# Patient Record
Sex: Female | Born: 1955 | Race: White | Hispanic: No | Marital: Married | Smoking: Former smoker
Health system: Southern US, Community
[De-identification: ages and names within clinical notes are randomized; demographics above are authoritative.]

## PROBLEM LIST (undated history)

## (undated) DIAGNOSIS — M8580 Other specified disorders of bone density and structure, unspecified site: Secondary | ICD-10-CM

## (undated) DIAGNOSIS — E079 Disorder of thyroid, unspecified: Secondary | ICD-10-CM

## (undated) DIAGNOSIS — E782 Mixed hyperlipidemia: Secondary | ICD-10-CM

## (undated) DIAGNOSIS — I7300 Raynaud's syndrome without gangrene: Secondary | ICD-10-CM

## (undated) DIAGNOSIS — E063 Autoimmune thyroiditis: Secondary | ICD-10-CM

## (undated) DIAGNOSIS — E039 Hypothyroidism, unspecified: Secondary | ICD-10-CM

## (undated) DIAGNOSIS — R768 Other specified abnormal immunological findings in serum: Secondary | ICD-10-CM

## (undated) DIAGNOSIS — C50919 Malignant neoplasm of unspecified site of unspecified female breast: Secondary | ICD-10-CM

## (undated) DIAGNOSIS — E042 Nontoxic multinodular goiter: Secondary | ICD-10-CM

## (undated) DIAGNOSIS — Z79811 Long term (current) use of aromatase inhibitors: Secondary | ICD-10-CM

## (undated) HISTORY — PX: BREAST BIOPSY: SHX20

## (undated) HISTORY — DX: Malignant neoplasm of unspecified site of unspecified female breast: C50.919

## (undated) HISTORY — DX: Disorder of thyroid, unspecified: E07.9

## (undated) HISTORY — PX: COLONOSCOPY: SHX174

---

## 2017-07-27 DIAGNOSIS — Z923 Personal history of irradiation: Secondary | ICD-10-CM

## 2017-07-27 DIAGNOSIS — Z9221 Personal history of antineoplastic chemotherapy: Secondary | ICD-10-CM

## 2017-07-27 DIAGNOSIS — C801 Malignant (primary) neoplasm, unspecified: Secondary | ICD-10-CM

## 2017-07-27 DIAGNOSIS — Z170 Estrogen receptor positive status [ER+]: Secondary | ICD-10-CM

## 2017-07-27 DIAGNOSIS — C50411 Malignant neoplasm of upper-outer quadrant of right female breast: Secondary | ICD-10-CM

## 2017-07-27 HISTORY — PX: BREAST LUMPECTOMY: SHX2

## 2017-07-27 HISTORY — DX: Personal history of antineoplastic chemotherapy: Z92.21

## 2017-07-27 HISTORY — PX: BREAST BIOPSY: SHX20

## 2017-07-27 HISTORY — DX: Malignant (primary) neoplasm, unspecified: C80.1

## 2017-07-27 HISTORY — DX: Malignant neoplasm of upper-outer quadrant of right female breast: Z17.0

## 2017-07-27 HISTORY — DX: Personal history of irradiation: Z92.3

## 2017-07-27 HISTORY — DX: Malignant neoplasm of upper-outer quadrant of right female breast: C50.411

## 2018-07-28 DIAGNOSIS — C50411 Malignant neoplasm of upper-outer quadrant of right female breast: Secondary | ICD-10-CM | POA: Insufficient documentation

## 2018-07-28 DIAGNOSIS — Z170 Estrogen receptor positive status [ER+]: Secondary | ICD-10-CM | POA: Insufficient documentation

## 2018-08-02 DIAGNOSIS — D701 Agranulocytosis secondary to cancer chemotherapy: Secondary | ICD-10-CM | POA: Insufficient documentation

## 2018-08-02 DIAGNOSIS — T451X5A Adverse effect of antineoplastic and immunosuppressive drugs, initial encounter: Secondary | ICD-10-CM | POA: Insufficient documentation

## 2019-08-30 ENCOUNTER — Other Ambulatory Visit: Payer: Self-pay | Admitting: Hematology and Oncology

## 2019-08-30 DIAGNOSIS — Z1231 Encounter for screening mammogram for malignant neoplasm of breast: Secondary | ICD-10-CM

## 2019-09-01 ENCOUNTER — Other Ambulatory Visit: Payer: Self-pay | Admitting: Hematology and Oncology

## 2019-09-01 DIAGNOSIS — Z853 Personal history of malignant neoplasm of breast: Secondary | ICD-10-CM

## 2019-09-01 DIAGNOSIS — Z1231 Encounter for screening mammogram for malignant neoplasm of breast: Secondary | ICD-10-CM

## 2019-09-08 ENCOUNTER — Ambulatory Visit
Admission: RE | Admit: 2019-09-08 | Discharge: 2019-09-08 | Disposition: A | Discharge status: Home / Self Care | Payer: 59 | Source: Ambulatory Visit | Attending: Hematology and Oncology | Admitting: Hematology and Oncology

## 2019-09-08 DIAGNOSIS — Z853 Personal history of malignant neoplasm of breast: Secondary | ICD-10-CM

## 2019-09-08 DIAGNOSIS — Z1231 Encounter for screening mammogram for malignant neoplasm of breast: Secondary | ICD-10-CM | POA: Diagnosis present

## 2020-06-11 ENCOUNTER — Encounter: Payer: Self-pay | Admitting: Internal Medicine

## 2020-06-11 ENCOUNTER — Other Ambulatory Visit: Payer: Self-pay | Admitting: Internal Medicine

## 2020-06-12 ENCOUNTER — Encounter: Payer: Self-pay | Admitting: Internal Medicine

## 2020-06-12 ENCOUNTER — Ambulatory Visit (INDEPENDENT_AMBULATORY_CARE_PROVIDER_SITE_OTHER): Payer: 59 | Admitting: Internal Medicine

## 2020-06-12 ENCOUNTER — Other Ambulatory Visit: Payer: Self-pay

## 2020-06-12 VITALS — BP 110/74 | HR 70 | Temp 98.0°F | Ht 65.5 in | Wt 175.0 lb

## 2020-06-12 DIAGNOSIS — G8929 Other chronic pain: Secondary | ICD-10-CM

## 2020-06-12 DIAGNOSIS — C50411 Malignant neoplasm of upper-outer quadrant of right female breast: Secondary | ICD-10-CM

## 2020-06-12 DIAGNOSIS — R1313 Dysphagia, pharyngeal phase: Secondary | ICD-10-CM | POA: Insufficient documentation

## 2020-06-12 DIAGNOSIS — Z170 Estrogen receptor positive status [ER+]: Secondary | ICD-10-CM

## 2020-06-12 DIAGNOSIS — Z23 Encounter for immunization: Secondary | ICD-10-CM

## 2020-06-12 DIAGNOSIS — M25512 Pain in left shoulder: Secondary | ICD-10-CM | POA: Diagnosis not present

## 2020-06-12 DIAGNOSIS — E042 Nontoxic multinodular goiter: Secondary | ICD-10-CM | POA: Insufficient documentation

## 2020-06-12 MED ORDER — PREDNISONE 10 MG PO TABS: 10.0000 mg | ORAL_TABLET | ORAL | 0 refills | Status: AC
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
## 2020-06-13 LAB — CBC WITH DIFFERENTIAL/PLATELET
Basophils Absolute: 0 10*3/uL (ref 0.0–0.2)
Basos: 1 %
EOS (ABSOLUTE): 0.3 10*3/uL (ref 0.0–0.4)
Eos: 6 %
Hematocrit: 38.9 % (ref 34.0–46.6)
Hemoglobin: 13 g/dL (ref 11.1–15.9)
Immature Grans (Abs): 0 10*3/uL (ref 0.0–0.1)
Immature Granulocytes: 0 %
Lymphocytes Absolute: 1.3 10*3/uL (ref 0.7–3.1)
Lymphs: 25 %
MCH: 30.8 pg (ref 26.6–33.0)
MCHC: 33.4 g/dL (ref 31.5–35.7)
MCV: 92 fL (ref 79–97)
Monocytes Absolute: 0.6 10*3/uL (ref 0.1–0.9)
Monocytes: 12 %
Neutrophils Absolute: 3 10*3/uL (ref 1.4–7.0)
Neutrophils: 56 %
Platelets: 242 10*3/uL (ref 150–450)
RBC: 4.22 x10E6/uL (ref 3.77–5.28)
RDW: 12.3 % (ref 11.7–15.4)
WBC: 5.3 10*3/uL (ref 3.4–10.8)

## 2020-06-13 LAB — COMPREHENSIVE METABOLIC PANEL
ALT: 25 IU/L (ref 0–32)
AST: 32 IU/L (ref 0–40)
Albumin/Globulin Ratio: 1.5 (ref 1.2–2.2)
Albumin: 4.3 g/dL (ref 3.8–4.8)
Alkaline Phosphatase: 94 IU/L (ref 44–121)
BUN/Creatinine Ratio: 18 (ref 12–28)
BUN: 14 mg/dL (ref 8–27)
Bilirubin Total: 0.4 mg/dL (ref 0.0–1.2)
CO2: 28 mmol/L (ref 20–29)
Calcium: 9.6 mg/dL (ref 8.7–10.3)
Chloride: 104 mmol/L (ref 96–106)
Creatinine, Ser: 0.76 mg/dL (ref 0.57–1.00)
GFR calc Af Amer: 97 mL/min/{1.73_m2} (ref >59)
GFR calc non Af Amer: 84 mL/min/{1.73_m2} (ref >59)
Globulin, Total: 2.9 g/dL (ref 1.5–4.5)
Glucose: 96 mg/dL (ref 65–99)
Potassium: 4.1 mmol/L (ref 3.5–5.2)
Sodium: 143 mmol/L (ref 134–144)
Total Protein: 7.2 g/dL (ref 6.0–8.5)

## 2020-06-13 LAB — CANCER ANTIGEN 27.29: CA 27.29: 11.4 U/mL (ref 0.0–38.6)

## 2020-06-14 ENCOUNTER — Inpatient Hospital Stay: Payer: 59

## 2020-06-14 ENCOUNTER — Inpatient Hospital Stay: Payer: 59 | Attending: Oncology | Admitting: Oncology

## 2020-06-14 ENCOUNTER — Other Ambulatory Visit: Payer: Self-pay | Admitting: *Deleted

## 2020-06-14 VITALS — BP 106/74 | HR 65 | Temp 97.6°F | Resp 18 | Wt 175.0 lb

## 2020-06-14 DIAGNOSIS — Z9221 Personal history of antineoplastic chemotherapy: Secondary | ICD-10-CM

## 2020-06-14 DIAGNOSIS — C50411 Malignant neoplasm of upper-outer quadrant of right female breast: Secondary | ICD-10-CM | POA: Insufficient documentation

## 2020-06-14 DIAGNOSIS — Z923 Personal history of irradiation: Secondary | ICD-10-CM | POA: Diagnosis not present

## 2020-06-14 DIAGNOSIS — Z79899 Other long term (current) drug therapy: Secondary | ICD-10-CM | POA: Insufficient documentation

## 2020-06-14 DIAGNOSIS — E063 Autoimmune thyroiditis: Secondary | ICD-10-CM | POA: Diagnosis not present

## 2020-06-14 DIAGNOSIS — Z79811 Long term (current) use of aromatase inhibitors: Secondary | ICD-10-CM | POA: Insufficient documentation

## 2020-06-14 DIAGNOSIS — Z5181 Encounter for therapeutic drug level monitoring: Secondary | ICD-10-CM

## 2020-06-14 DIAGNOSIS — Z170 Estrogen receptor positive status [ER+]: Secondary | ICD-10-CM | POA: Insufficient documentation

## 2020-06-14 MED ORDER — ANASTROZOLE 1 MG PO TABS
1.0000 mg | ORAL_TABLET | Freq: Every day | ORAL | 3 refills | Status: DC
Start: 2020-06-14 — End: 2020-10-04

## 2020-06-18 NOTE — Progress Notes (Signed)
Hematology/Oncology Consult note [facility] Telephone:([phone] [phone]  Patient Care Team: Glean Hess, MD as PCP - General (Internal Medicine) Lonia Farber, MD as Consulting Physician (Endocrinology)   Name of the patient: Alexa Knight  [id number]  [birth year]    Reason for referral-history of breast cancer   Referring physician-Dr. Frederic Jericho  Date of visit: 06/18/20   History of presenting illness- Patient is a 64 year old female who was diagnosed with stage I right breast cancer in 2019.  She had T1CN1A IDC of the right breast that was ER/PR positive and HER-2/neu negative.  Core needle biopsy had showed grade 1 ER 100% positive PR 10% positive and HER-2 negative tumor with a Ki-67 of 5%.  In October 2019 she underwent right lumpectomy with sentinel lymph node dissection which showed a 1.5 cm tumor with positive LVI and 2 out of 2 sentinel lymph nodes that were positive with no evidence of extra nuclear extension.  She received adjuvant dose dense AC chemotherapy and weekly Taxol between December 2019 to May 2020.  She then completed adjuvant radiation therapy and started Arimidex in July 2020.  Patient also has history of Hashimoto's thyroiditis for which she is on levothyroxine.  She was being followed by Dr. Frederic Jericho in [place] and patient has now relocated to [facility].  Patient had a recent mammogram in February 2021 which was normal.  She currently reports doing well and denies any new aches and pains anywhere.  Appetite and weight have remained stable  ECOG PS- 0  Pain scale- 0   Review of systems- Review of Systems  Constitutional: Negative for chills, fever, malaise/fatigue and weight loss.  HENT: Negative for congestion, ear discharge and nosebleeds.   Eyes: Negative for blurred vision.  Respiratory: Negative for cough, hemoptysis, sputum production, shortness of breath and wheezing.     Cardiovascular: Negative for chest pain, palpitations, orthopnea and claudication.  Gastrointestinal: Negative for abdominal pain, blood in stool, constipation, diarrhea, heartburn, melena, nausea and vomiting.  Genitourinary: Negative for dysuria, flank pain, frequency, hematuria and urgency.  Musculoskeletal: Negative for back pain, joint pain and myalgias.  Skin: Negative for rash.  Neurological: Negative for dizziness, tingling, focal weakness, seizures, weakness and headaches.  Endo/Heme/Allergies: Does not bruise/bleed easily.  Psychiatric/Behavioral: Negative for depression and suicidal ideas. The patient does not have insomnia.     No Known Allergies  Patient Active Problem List   Diagnosis Date Noted  . Multinodular goiter 06/12/2020  . Pharyngeal dysphagia 06/12/2020  . 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 in female, estrogen receptor positive ([place]) 07/28/2018     Past Medical History:  Diagnosis Date  . Cancer [facility]) 2019   Right breast  . Personal history of chemotherapy 2019   Rt breast  . Personal history of radiation therapy 2019   Rt breast  . Thyroid disease      Past Surgical History:  Procedure Laterality Date  . BREAST BIOPSY Left    stereo-neg  . BREAST LUMPECTOMY Right 2019   invasive ductal carcinoma    Social History   Socioeconomic History  . Marital status: Married    Spouse name: Not on file  . Number of children: 2  . Years of education: Not on file  . Highest education level: Not on file  Occupational History  . Not on file  Tobacco Use  . Smoking status: Former Smoker    Packs/day: 1.00    Years: 26.00  Pack years: 26.00    Types: Cigarettes    Quit date: 2000    Years since quitting: 21.9  . Smokeless tobacco: Never Used  Vaping Use  . Vaping Use: Never assessed  Substance and Sexual Activity  . Alcohol use: Yes    Comment: rare   . Drug use: Never  . Sexual activity: Not Currently  Other Topics Concern   . Not on file  Social History Narrative  . Not on file   Social Determinants of Health   Financial Resource Strain:   . Difficulty of Paying Living Expenses: Not on file  Food Insecurity:   . Worried About Charity fundraiser in the Last Year: Not on file  . Ran Out of Food in the Last Year: Not on file  Transportation Needs:   . Lack of Transportation (Medical): Not on file  . Lack of Transportation (Non-Medical): Not on file  Physical Activity:   . Days of Exercise per Week: Not on file  . Minutes of Exercise per Session: Not on file  Stress:   . Feeling of Stress : Not on file  Social Connections:   . Frequency of Communication with Friends and Family: Not on file  . Frequency of Social Gatherings with Friends and Family: Not on file  . Attends Religious Services: Not on file  . Active Member of Clubs or Organizations: Not on file  . Attends Archivist Meetings: Not on file  . Marital Status: Not on file  Intimate Partner Violence:   . Fear of Current or Ex-Partner: Not on file  . Emotionally Abused: Not on file  . Physically Abused: Not on file  . Sexually Abused: Not on file     Family History  Problem Relation Age of Onset  . Breast cancer Mother 63  . Breast cancer Maternal Aunt   . Heart attack Father      Current Outpatient Medications:  .  levothyroxine (SYNTHROID) 100 MCG tablet, Take 100 mcg by mouth daily before breakfast. , Disp: , Rfl:  .  Multiple Vitamin (MULTI-VITAMIN DAILY PO), Take by mouth., Disp: , Rfl:  .  predniSONE (DELTASONE) 10 MG tablet, Take 1 tablet (10 mg total) by mouth as directed for 6 days. Take 6,5,4,3,2,1 then stop, Disp: 21 tablet, Rfl: 0 .  anastrozole (ARIMIDEX) 1 MG tablet, Take 1 tablet (1 mg total) by mouth daily., Disp: 30 tablet, Rfl: 3   Physical exam:  Vitals:   06/14/20 1426  BP: 106/74  Pulse: 65  Resp: 18  Temp: 97.6 F (36.4 C)  TempSrc: Tympanic  SpO2: 100%  Weight: 175 lb (79.4 kg)     Physical Exam Constitutional:      General: She is not in acute distress. Cardiovascular:     Rate and Rhythm: Normal rate and regular rhythm.     Heart sounds: Normal heart sounds.  Pulmonary:     Effort: Pulmonary effort is normal.     Breath sounds: Normal breath sounds.  Abdominal:     General: Bowel sounds are normal.     Palpations: Abdomen is soft.  Skin:    General: Skin is warm and dry.  Neurological:     Mental Status: She is alert and oriented to person, place, and time.    Breast exam was performed in seated and lying down position. Patient is status post right lumpectomy with a well-healed surgical scar. No evidence of any palpable masses. No evidence of axillary adenopathy. No  evidence of any palpable masses or lumps in the left breast. No evidence of leftt axillary adenopathy    CMP Latest Ref Rng & Units 06/12/2020  Glucose 65 - 99 mg/dL 96  BUN 8 - 27 mg/dL 14  Creatinine 0.57 - 1.00 mg/dL 0.76  Sodium 134 - 144 mmol/L 143  Potassium 3.5 - 5.2 mmol/L 4.1  Chloride 96 - 106 mmol/L 104  CO2 20 - 29 mmol/L 28  Calcium 8.7 - 10.3 mg/dL 9.6  Total Protein 6.0 - 8.5 g/dL 7.2  Total Bilirubin 0.0 - 1.2 mg/dL 0.4  Alkaline Phos 44 - 121 IU/L 94  AST 0 - 40 IU/L 32  ALT 0 - 32 IU/L 25   CBC Latest Ref Rng & Units 06/12/2020  WBC 3.4 - 10.8 x10E3/uL 5.3  Hemoglobin 11.1 - 15.9 g/dL 13.0  Hematocrit 34.0 - 46.6 % 38.9  Platelets 150 - 450 x10E3/uL 242     Assessment and plan- Patient is a 64 y.o. female with history of stage I invasive mammary carcinoma of the right breast pT1 cpN1 acM0 ER/PR positive HER-2 negative s/p lumpectomy adjuvant radiation therapy as well as adjuvant dose dense AC Taxol chemotherapy in 2019 and currently on Arimidex.  She is here to establish follow-up with oncology after moving from [place]  Clinically patient is doing well with no concerning signs and symptoms of recurrence based on today's exam.  She would be due for  diagnostic mammogram in February 2022 which we will schedule.She continues to tolerate Arimidex well without any significant side effects.  I will see her back in 6 months with no labs.  Patient will also need a bone density scan prior to my next visit.  Encouraged patient to take calcium and vitamin D along with Arimidex.  Also discussed adjuvant bisphosphonates for high risk ER positive breast cancer.  Patient does not wish to proceed with that at this time   Thank you for this kind referral and the opportunity to participate in the care of this patient   Visit Diagnosis 1. 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 in female, estrogen receptor positive ([place])     Dr. Randa Evens, MD, MPH [facility] at [facility] [phone] 06/18/2020  8:48 AM

## 2020-09-23 ENCOUNTER — Encounter: Payer: Self-pay | Admitting: Podiatry

## 2020-09-23 ENCOUNTER — Ambulatory Visit (INDEPENDENT_AMBULATORY_CARE_PROVIDER_SITE_OTHER): Payer: 59 | Admitting: Podiatry

## 2020-09-23 ENCOUNTER — Ambulatory Visit (INDEPENDENT_AMBULATORY_CARE_PROVIDER_SITE_OTHER): Payer: 59

## 2020-09-23 ENCOUNTER — Other Ambulatory Visit: Payer: Self-pay | Admitting: Podiatry

## 2020-09-23 ENCOUNTER — Other Ambulatory Visit: Payer: Self-pay

## 2020-09-23 DIAGNOSIS — M778 Other enthesopathies, not elsewhere classified: Secondary | ICD-10-CM | POA: Diagnosis not present

## 2020-09-23 DIAGNOSIS — M25872 Other specified joint disorders, left ankle and foot: Secondary | ICD-10-CM | POA: Diagnosis not present

## 2020-09-23 NOTE — Progress Notes (Signed)
Subjective:  Patient ID: Alexa Knight, female    DOB: 1955/08/06,  MRN: [id number] HPI Chief Complaint  Patient presents with  . Foot Pain    1st MPJ left - aching through last week, noticed on Friday area became red, swollen, extremely painful, and hot, took Naprosyn and rested, felt better next day, patient says she also has a neuroma left foot  . New Patient (Initial Visit)    65 y.o. female presents with the above complaint.   ROS: Denies fever chills nausea vomiting muscle aches pains calf pain back pain chest pain shortness of breath.  Past Medical History:  Diagnosis Date  . Cancer [facility]) 2019   Right breast  . Personal history of chemotherapy 2019   Rt breast  . Personal history of radiation therapy 2019   Rt breast  . Thyroid disease    Past Surgical History:  Procedure Laterality Date  . BREAST BIOPSY Left    stereo-neg  . BREAST LUMPECTOMY Right 2019   invasive ductal carcinoma    Current Outpatient Medications:  .  anastrozole (ARIMIDEX) 1 MG tablet, Take 1 tablet (1 mg total) by mouth daily., Disp: 30 tablet, Rfl: 3 .  levothyroxine (SYNTHROID) 100 MCG tablet, Take 100 mcg by mouth daily before breakfast. , Disp: , Rfl:  .  Multiple Vitamin (MULTI-VITAMIN DAILY PO), Take by mouth., Disp: , Rfl:   No Known Allergies Review of Systems Objective:  There were no vitals filed for this visit.  General: Well developed, nourished, in no acute distress, alert and oriented x3   Dermatological: Skin is warm, dry and supple bilateral. Nails x 10 are well maintained; remaining integument appears unremarkable at this time. There are no open sores, no preulcerative lesions, no rash or signs of infection present.  Vascular: Dorsalis Pedis artery and Posterior Tibial artery pedal pulses are 2/4 bilateral with immedate capillary fill time. Pedal hair growth present. No varicosities and no lower extremity edema present bilateral.   Neruologic: Grossly intact via light touch  bilateral. Vibratory intact via tuning fork bilateral. Protective threshold with Semmes Wienstein monofilament intact to all pedal sites bilateral. Patellar and Achilles deep tendon reflexes 2+ bilateral. No Babinski or clonus noted bilateral.   Musculoskeletal: No gross boney pedal deformities bilateral. No pain, crepitus, or limitation noted with foot and ankle range of motion bilateral. Muscular strength 5/5 in all groups tested bilateral.  Pain on palpation plantar medial aspect of the first metatarsophalangeal joint with mild erythema no cellulitis drainage or odor though these erythema does surround the medial aspect of the hypertrophic medial condyle extending to the dorsal aspect of the foot as well it is warm to the touch.    Gait: Unassisted, Nonantalgic.    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  But she does have an increase in the first intermetatarsal angle greater than normal value.  Hallux abductus angle greater than normal value.  Assessment & Plan:   Assessment: Cannot rule out sesamoiditis but more likely an acute gout attack.  Plan: Discussed etiology pathology conservative versus surgical therapies.  At this point I am requesting blood work consisting of a arthritic panel and CBC.  I will follow-up with her once those come in.  Should this patient have her call complaining of similar symptoms she is to come in and obtain an arthritic panel immediately.  If there are  doctors to see her then we will see her that day if not we will see her immediately following that day.     Brooke Payes T. [place], [place]

## 2020-09-24 LAB — C-REACTIVE PROTEIN: CRP: 5 mg/L (ref 0–10)

## 2020-09-24 LAB — CBC WITH DIFFERENTIAL/PLATELET
Basophils Absolute: 0.1 10*3/uL (ref 0.0–0.2)
Basos: 1 %
EOS (ABSOLUTE): 0.3 10*3/uL (ref 0.0–0.4)
Eos: 5 %
Hematocrit: 38.2 % (ref 34.0–46.6)
Hemoglobin: 12.7 g/dL (ref 11.1–15.9)
Immature Grans (Abs): 0 10*3/uL (ref 0.0–0.1)
Immature Granulocytes: 0 %
Lymphocytes Absolute: 1.4 10*3/uL (ref 0.7–3.1)
Lymphs: 28 %
MCH: 29.5 pg (ref 26.6–33.0)
MCHC: 33.2 g/dL (ref 31.5–35.7)
MCV: 89 fL (ref 79–97)
Monocytes Absolute: 0.6 10*3/uL (ref 0.1–0.9)
Monocytes: 13 %
Neutrophils Absolute: 2.6 10*3/uL (ref 1.4–7.0)
Neutrophils: 53 %
Platelets: 270 10*3/uL (ref 150–450)
RBC: 4.3 x10E6/uL (ref 3.77–5.28)
RDW: 12.4 % (ref 11.7–15.4)
WBC: 4.9 10*3/uL (ref 3.4–10.8)

## 2020-09-24 LAB — ANA: Anti Nuclear Antibody (ANA): POSITIVE — AB

## 2020-09-24 LAB — URIC ACID: Uric Acid: 3.5 mg/dL (ref 3.0–7.2)

## 2020-09-24 LAB — SEDIMENTATION RATE: Sed Rate: 22 mm/hr (ref 0–40)

## 2020-09-24 LAB — RHEUMATOID FACTOR: Rheumatoid fact SerPl-aCnc: 34.9 IU/mL — ABNORMAL HIGH (ref <14.0)

## 2020-09-26 ENCOUNTER — Telehealth: Payer: Self-pay

## 2020-09-26 DIAGNOSIS — R768 Other specified abnormal immunological findings in serum: Secondary | ICD-10-CM
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
## 2020-09-26 NOTE — Telephone Encounter (Signed)
Patient has been notified of results and referral information.    Referral has been faxed to [facility] Rheumatology dept. Fax# [phone]

## 2020-10-04 ENCOUNTER — Other Ambulatory Visit (HOSPITAL_COMMUNITY)
Admission: RE | Admit: 2020-10-04 | Discharge: 2020-10-04 | Disposition: A | Discharge status: Home / Self Care | Payer: 59 | Source: Ambulatory Visit | Attending: Internal Medicine | Admitting: Internal Medicine

## 2020-10-04 ENCOUNTER — Ambulatory Visit (INDEPENDENT_AMBULATORY_CARE_PROVIDER_SITE_OTHER): Payer: 59 | Admitting: Internal Medicine

## 2020-10-04 ENCOUNTER — Telehealth: Payer: Self-pay | Admitting: Oncology

## 2020-10-04 ENCOUNTER — Other Ambulatory Visit: Payer: Self-pay

## 2020-10-04 ENCOUNTER — Encounter: Payer: Self-pay | Admitting: Internal Medicine

## 2020-10-04 ENCOUNTER — Other Ambulatory Visit: Payer: Self-pay | Admitting: *Deleted

## 2020-10-04 VITALS — BP 104/78 | HR 61 | Temp 98.2°F | Ht 65.5 in | Wt 190.0 lb

## 2020-10-04 DIAGNOSIS — Z170 Estrogen receptor positive status [ER+]: Secondary | ICD-10-CM

## 2020-10-04 DIAGNOSIS — Z1159 Encounter for screening for other viral diseases: Secondary | ICD-10-CM

## 2020-10-04 DIAGNOSIS — C50411 Malignant neoplasm of upper-outer quadrant of right female breast: Secondary | ICD-10-CM

## 2020-10-04 DIAGNOSIS — Z124 Encounter for screening for malignant neoplasm of cervix: Secondary | ICD-10-CM | POA: Diagnosis present

## 2020-10-04 DIAGNOSIS — Z1211 Encounter for screening for malignant neoplasm of colon: Secondary | ICD-10-CM

## 2020-10-04 DIAGNOSIS — Z0000 Encounter for general adult medical examination without abnormal findings: Secondary | ICD-10-CM

## 2020-10-04 DIAGNOSIS — I7300 Raynaud's syndrome without gangrene: Secondary | ICD-10-CM

## 2020-10-04 DIAGNOSIS — E042 Nontoxic multinodular goiter: Secondary | ICD-10-CM

## 2020-10-04 DIAGNOSIS — Z5181 Encounter for therapeutic drug level monitoring: Secondary | ICD-10-CM

## 2020-10-04 MED ORDER — ANASTROZOLE 1 MG PO TABS: 1.0000 mg | ORAL_TABLET | Freq: Every day | ORAL | 3 refills | Status: DC

## 2020-10-04 NOTE — Progress Notes (Addendum)
Date:  10/04/2020   Name:  Alexa Knight   DOB:  Nov 26, 1955   MRN:  [id number]   Chief Complaint: Annual Exam (Breast Exam. No pap.)  Alexa Knight is a 65 y.o. female who presents today for her Complete Annual Exam. She feels well. She reports exercising - not during the winter. She reports she is sleeping well. Breast complaints - pain in Rt breast- hx of breast cx in this breast.  Mammogram: 08/2019 ordered by oncology DEXA: ordered by oncology Pap smear: due Colonoscopy: due last colon age 59 normal  Immunization History  Administered Date(s) Administered  . Influenza,inj,Quad PF,6+ Mos 05/30/2018, 06/12/2020  . PFIZER(Purple Top)SARS-COV-2 Vaccination 11/17/2019, 12/08/2019    Thyroid Problem Presents for follow-up (followed by Endocrinology) visit. Patient reports no anxiety, constipation, diarrhea, fatigue, palpitations or tremors. The symptoms have been stable.   Foot pain - seen by Podiatry - labs done showed high RF and ANA.  She is scheduled to see Rheumatology next month.  Lab Results  Component Value Date   CREATININE 0.76 06/12/2020   BUN 14 06/12/2020   NA 143 06/12/2020   K 4.1 06/12/2020   CL 104 06/12/2020   CO2 28 06/12/2020   No results found for: CHOL, HDL, LDLCALC, LDLDIRECT, TRIG, CHOLHDL No results found for: TSH No results found for: HGBA1C Lab Results  Component Value Date   WBC 4.9 09/23/2020   HGB 12.7 09/23/2020   HCT 38.2 09/23/2020   MCV 89 09/23/2020   PLT 270 09/23/2020   Lab Results  Component Value Date   ALT 25 06/12/2020   AST 32 06/12/2020   ALKPHOS 94 06/12/2020   BILITOT 0.4 06/12/2020   Lab Results  Component Value Date   RF 34.9 (H) 09/23/2020   Lab Results  Component Value Date   ANA Positive (A) 09/23/2020      Review of Systems  Constitutional: Negative for chills, fatigue and fever.  HENT: Negative for congestion, hearing loss, tinnitus, trouble swallowing and voice change.   Eyes: Negative for visual  disturbance.  Respiratory: Negative for cough, chest tightness, shortness of breath and wheezing.   Cardiovascular: Negative for chest pain, palpitations and leg swelling.  Gastrointestinal: Negative for abdominal pain, constipation, diarrhea and vomiting.  Endocrine: Negative for polydipsia and polyuria.  Genitourinary: Negative for dysuria, frequency, genital sores, vaginal bleeding and vaginal discharge.  Musculoskeletal: Negative for arthralgias, gait problem and joint swelling.  Skin: Negative for color change and rash.  Neurological: Negative for dizziness, tremors, light-headedness and headaches.  Hematological: Negative for adenopathy. Does not bruise/bleed easily.  Psychiatric/Behavioral: Negative for dysphoric mood and sleep disturbance. The patient is not nervous/anxious.     Patient Active Problem List   Diagnosis Date Noted  . Multinodular goiter 06/12/2020  . Pharyngeal dysphagia 06/12/2020  . 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 in female, estrogen receptor positive ([place]) 07/28/2018    No Known Allergies  Past Surgical History:  Procedure Laterality Date  . BREAST BIOPSY Left    stereo-neg  . BREAST LUMPECTOMY Right 2019   invasive ductal carcinoma    Social History   Tobacco Use  . Smoking status: Former Smoker    Packs/day: 1.00    Years: 26.00    Pack years: 26.00    Types: Cigarettes    Quit date: 2000    Years since quitting: 22.2  . Smokeless tobacco: Never Used  Substance Use Topics  . Alcohol use: Yes    Comment: rare   .  Drug use: Never     Medication list has been reviewed and updated.  Current Meds  Medication Sig  . anastrozole (ARIMIDEX) 1 MG tablet Take 1 tablet (1 mg total) by mouth daily.  Marland Kitchen levothyroxine (SYNTHROID) 100 MCG tablet Take 100 mcg by mouth daily before breakfast.   . Multiple Vitamin (MULTI-VITAMIN DAILY PO) Take by mouth.    PHQ 2/9 Scores 10/04/2020 06/12/2020  PHQ - 2 Score 0 0  PHQ- 9 Score 0 0     GAD 7 : Generalized Anxiety Score 10/04/2020 06/12/2020  Nervous, Anxious, on Edge 0 0  Control/stop worrying 0 0  Worry too much - different things 0 0  Trouble relaxing 0 0  Restless 0 0  Easily annoyed or irritable 0 0  Afraid - awful might happen 0 0  Total GAD 7 Score 0 0  Anxiety Difficulty Not difficult at all Not difficult at all    BP Readings from Last 3 Encounters:  10/04/20 104/78  06/14/20 106/74  06/12/20 110/74    Physical Exam Vitals and nursing note reviewed.  Constitutional:      General: She is not in acute distress.    Appearance: She is well-developed.  HENT:     Head: Normocephalic and atraumatic.     Right Ear: Tympanic membrane and ear canal normal.     Left Ear: Tympanic membrane and ear canal normal.     Nose:     Right Sinus: No maxillary sinus tenderness.     Left Sinus: No maxillary sinus tenderness.  Eyes:     General: No scleral icterus.       Right eye: No discharge.        Left eye: No discharge.     Conjunctiva/sclera: Conjunctivae normal.  Neck:     Thyroid: No thyromegaly.     Vascular: No carotid bruit.  Cardiovascular:     Rate and Rhythm: Normal rate and regular rhythm.     Pulses: Normal pulses.     Heart sounds: Normal heart sounds.  Pulmonary:     Effort: Pulmonary effort is normal. No respiratory distress.     Breath sounds: No wheezing.  Chest:  Breasts:     Right: No mass, nipple discharge, skin change or tenderness.     Left: No mass, nipple discharge, skin change or tenderness.    Abdominal:     General: Bowel sounds are normal.     Palpations: Abdomen is soft.     Tenderness: There is no abdominal tenderness.  Genitourinary:    Labia:        Right: No tenderness, lesion or injury.        Left: No tenderness, lesion or injury.      Vagina: Normal.     Cervix: Normal.     Uterus: Normal.      Adnexa: Right adnexa normal and left adnexa normal.     Comments: Mildly atrophic vaginal  mucosa Musculoskeletal:     Cervical back: Normal range of motion. No erythema.     Right lower leg: No edema.     Left lower leg: No edema.     Right foot: Bunion present.     Left foot: Bunion present.     Comments: Cool fingers and toes (raynauds)  Lymphadenopathy:     Cervical: No cervical adenopathy.  Skin:    General: Skin is warm and dry.     Findings: No rash.  Neurological:     Mental  Status: She is alert and oriented to person, place, and time.     Cranial Nerves: No cranial nerve deficit.     Sensory: No sensory deficit.     Deep Tendon Reflexes: Reflexes are normal and symmetric.  Psychiatric:        Attention and Perception: Attention normal.        Mood and Affect: Mood normal.     Wt Readings from Last 3 Encounters:  10/04/20 190 lb (86.2 kg)  06/14/20 175 lb (79.4 kg)  06/12/20 175 lb (79.4 kg)    BP 104/78   Pulse 61   Temp 98.2 F (36.8 C) (Oral)   Ht 5' 5.5" (1.664 m)   Wt 190 lb (86.2 kg)   SpO2 98%   BMI 31.14 kg/m   Assessment and Plan: 1. Annual physical exam Normal exam Continue healthy diet and exercise - Lipid panel  2. Colon cancer screening - Fecal occult blood, imunochemical  3. Need for hepatitis C screening test - Hepatitis C antibody  4. Encounter for screening for cervical cancer Obtained today - Cytology - PAP  5. Multinodular goiter Stable without symptoms Intermittent dysphagia is likely not due to goiter per Endocrinology - TSH  6. 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 in female, estrogen receptor positive ([place]) Followed by Oncology Sent message for them to schedule mammograms and DEXA - anastrozole (ARIMIDEX) 1 MG tablet; Take 1 tablet (1 mg total) by mouth daily.  Dispense: 90 tablet; Refill: 3   Partially dictated using Editor, commissioning. Any errors are unintentional.  Halina Maidens, MD [facility]  10/04/2020

## 2020-10-04 NOTE — Telephone Encounter (Signed)
Called pt to make her aware of Bone Density and Mammogram scheduled. Patient agreeable to day and time. Sending appt reminder.

## 2020-10-05 ENCOUNTER — Encounter: Payer: Self-pay | Admitting: Internal Medicine

## 2020-10-05 DIAGNOSIS — E782 Mixed hyperlipidemia: Secondary | ICD-10-CM | POA: Insufficient documentation

## 2020-10-05 LAB — LIPID PANEL
Chol/HDL Ratio: 4.6 ratio — ABNORMAL HIGH (ref 0.0–4.4)
Cholesterol, Total: 207 mg/dL — ABNORMAL HIGH (ref 100–199)
HDL: 45 mg/dL (ref >39)
LDL Chol Calc (NIH): 132 mg/dL — ABNORMAL HIGH (ref 0–99)
Triglycerides: 168 mg/dL — ABNORMAL HIGH (ref 0–149)
VLDL Cholesterol Cal: 30 mg/dL (ref 5–40)

## 2020-10-05 LAB — HEPATITIS C ANTIBODY: Hep C Virus Ab: <0.1 s/co ratio (ref 0.0–0.9)

## 2020-10-05 LAB — TSH: TSH: 3.86 u[IU]/mL (ref 0.450–4.500)

## 2020-10-07 LAB — CYTOLOGY - PAP
Comment: NEGATIVE
Diagnosis: NEGATIVE
High risk HPV: NEGATIVE

## 2020-10-09 LAB — FECAL OCCULT BLOOD, IMMUNOCHEMICAL: Fecal Occult Bld: NEGATIVE

## 2020-10-16 ENCOUNTER — Telehealth: Payer: Self-pay

## 2020-10-16 NOTE — Telephone Encounter (Signed)
Patient has appt scheduled with Dr. Catalina Lunger clinic Rheumatology on 10/30/2020 @ 11am

## 2020-10-18 ENCOUNTER — Encounter: Payer: 59 | Admitting: Internal Medicine

## 2020-10-21 ENCOUNTER — Ambulatory Visit
Admission: RE | Admit: 2020-10-21 | Discharge: 2020-10-21 | Disposition: A | Discharge status: Home / Self Care | Payer: 59 | Source: Ambulatory Visit | Attending: Oncology | Admitting: Oncology

## 2020-10-21 ENCOUNTER — Other Ambulatory Visit: Payer: Self-pay

## 2020-10-21 DIAGNOSIS — Z170 Estrogen receptor positive status [ER+]: Secondary | ICD-10-CM

## 2020-10-21 DIAGNOSIS — Z5181 Encounter for therapeutic drug level monitoring: Secondary | ICD-10-CM | POA: Insufficient documentation

## 2020-10-21 DIAGNOSIS — Z79811 Long term (current) use of aromatase inhibitors: Secondary | ICD-10-CM | POA: Diagnosis present

## 2020-10-21 DIAGNOSIS — C50411 Malignant neoplasm of upper-outer quadrant of right female breast: Secondary | ICD-10-CM | POA: Diagnosis present

## 2020-10-30 DIAGNOSIS — R768 Other specified abnormal immunological findings in serum: Secondary | ICD-10-CM | POA: Insufficient documentation

## 2020-11-21 ENCOUNTER — Telehealth: Payer: Self-pay | Admitting: Oncology

## 2020-11-21 NOTE — Telephone Encounter (Signed)
Spoke with patient to r/s appt on 5/19 due to a change in provider's clinic scheduled. Patient agreeable to come in on 5/20.

## 2020-12-12 ENCOUNTER — Ambulatory Visit: Payer: 59 | Admitting: Oncology

## 2020-12-13 ENCOUNTER — Encounter: Payer: Self-pay | Admitting: Oncology

## 2020-12-13 ENCOUNTER — Inpatient Hospital Stay: Payer: 59 | Attending: Oncology | Admitting: Oncology

## 2020-12-13 DIAGNOSIS — Z170 Estrogen receptor positive status [ER+]: Secondary | ICD-10-CM | POA: Diagnosis not present

## 2020-12-13 DIAGNOSIS — Z853 Personal history of malignant neoplasm of breast: Secondary | ICD-10-CM

## 2020-12-13 DIAGNOSIS — Z803 Family history of malignant neoplasm of breast: Secondary | ICD-10-CM | POA: Insufficient documentation

## 2020-12-13 DIAGNOSIS — Z79811 Long term (current) use of aromatase inhibitors: Secondary | ICD-10-CM | POA: Diagnosis not present

## 2020-12-13 DIAGNOSIS — Z08 Encounter for follow-up examination after completed treatment for malignant neoplasm: Secondary | ICD-10-CM | POA: Diagnosis not present

## 2020-12-13 DIAGNOSIS — Z87891 Personal history of nicotine dependence: Secondary | ICD-10-CM | POA: Diagnosis not present

## 2020-12-13 DIAGNOSIS — Z9221 Personal history of antineoplastic chemotherapy: Secondary | ICD-10-CM | POA: Insufficient documentation

## 2020-12-13 DIAGNOSIS — Z5181 Encounter for therapeutic drug level monitoring: Secondary | ICD-10-CM

## 2020-12-13 DIAGNOSIS — Z923 Personal history of irradiation: Secondary | ICD-10-CM | POA: Insufficient documentation

## 2020-12-13 DIAGNOSIS — C50911 Malignant neoplasm of unspecified site of right female breast: Secondary | ICD-10-CM | POA: Diagnosis present

## 2020-12-13 NOTE — Progress Notes (Signed)
Pt says that sometimes when she stretches with right arm. She has a stinging pain and it does not last very long. No other concerns.

## 2020-12-13 NOTE — Progress Notes (Signed)
Hematology/Oncology Consult note [facility]  [phone] [phone]  Patient Care Team: Glean Hess, MD as PCP - General (Internal Medicine) Lonia Farber, MD as Consulting Physician (Endocrinology) Sindy Guadeloupe, MD as Consulting Physician (Oncology)   Name of the patient: Alexa Knight  [id number]  1955/09/05   Date of visit: 12/13/20  Diagnosis-history of stage I right breast cancer in 2019 currently on Arimidex  Chief complaint/ Reason for visit-routine follow-up of breast cancer  Heme/Onc history: Patient is a 65 year old female who was diagnosed with stage I right breast cancer in 2019.  She had T1CN1A IDC of the right breast that was ER/PR positive and HER-2/neu negative.  Core needle biopsy had showed grade 1 ER 100% positive PR 10% positive and HER-2 negative tumor with a Ki-67 of 5%.  In October 2019 she underwent right lumpectomy with sentinel lymph node dissection which showed a 1.5 cm tumor with positive LVI and 2 out of 2 sentinel lymph nodes that were positive with no evidence of extra nuclear extension.  She received adjuvant dose dense AC chemotherapy and weekly Taxol between December 2019 to May 2020.  She then completed adjuvant radiation therapy and started Arimidex in July 2020.  Patient also has history of Hashimoto's thyroiditis for which she is on levothyroxine.  She was being followed by Dr. Frederic Jericho in [place] and patient has now relocated to [facility].  Patient had a recent mammogram in February 2021 which was normal.   Interval history-patient reports tolerating Arimidex well without any significant side effects.She has been taking her routine multivitamin but does not take any calcium or vitamin D separately.  Denies any breast concerns at this time.  Has occasional pain at the lumpectomy site which comes and goes  ECOG PS- 0 Pain scale- 0   Review of systems- Review of Systems   Constitutional: Negative for chills, fever, malaise/fatigue and weight loss.  HENT: Negative for congestion, ear discharge and nosebleeds.   Eyes: Negative for blurred vision.  Respiratory: Negative for cough, hemoptysis, sputum production, shortness of breath and wheezing.   Cardiovascular: Negative for chest pain, palpitations, orthopnea and claudication.  Gastrointestinal: Negative for abdominal pain, blood in stool, constipation, diarrhea, heartburn, melena, nausea and vomiting.  Genitourinary: Negative for dysuria, flank pain, frequency, hematuria and urgency.  Musculoskeletal: Negative for back pain, joint pain and myalgias.  Skin: Negative for rash.  Neurological: Negative for dizziness, tingling, focal weakness, seizures, weakness and headaches.  Endo/Heme/Allergies: Does not bruise/bleed easily.  Psychiatric/Behavioral: Negative for depression and suicidal ideas. The patient does not have insomnia.      No Known Allergies   Past Medical History:  Diagnosis Date  . Breast cancer ([place])   . Cancer [facility]) 2019   Right breast  . Personal history of chemotherapy 2019   Rt breast  . Personal history of radiation therapy 2019   Rt breast  . Thyroid disease      Past Surgical History:  Procedure Laterality Date  . BREAST BIOPSY Left    stereo-neg  . BREAST LUMPECTOMY Right 2019   invasive ductal carcinoma    Social History   Socioeconomic History  . Marital status: Married    Spouse name: Not on file  . Number of children: 2  . Years of education: Not on file  . Highest education level: Not on file  Occupational History  . Not on file  Tobacco Use  . Smoking status: Former  Smoker    Packs/day: 1.00    Years: 26.00    Pack years: 26.00    Types: Cigarettes    Quit date: 2000    Years since quitting: 22.3  . Smokeless tobacco: Never Used  Vaping Use  . Vaping Use: Never used  Substance and Sexual Activity  . Alcohol use: Yes    Comment: rare   . Drug use:  Never  . Sexual activity: Not Currently  Other Topics Concern  . Not on file  Social History Narrative  . Not on file   Social Determinants of Health   Financial Resource Strain: Not on file  Food Insecurity: Not on file  Transportation Needs: Not on file  Physical Activity: Not on file  Stress: Not on file  Social Connections: Not on file  Intimate Partner Violence: Not on file    Family History  Problem Relation Age of Onset  . Breast cancer Mother 10  . Breast cancer Maternal Aunt   . Heart attack Father      Current Outpatient Medications:  .  anastrozole (ARIMIDEX) 1 MG tablet, Take 1 tablet (1 mg total) by mouth daily., Disp: 90 tablet, Rfl: 3 .  levothyroxine (SYNTHROID) 100 MCG tablet, Take 100 mcg by mouth daily before breakfast. , Disp: , Rfl:  .  Multiple Vitamin (MULTI-VITAMIN DAILY PO), Take by mouth., Disp: , Rfl:   Physical exam: There were no vitals filed for this visit. Physical Exam Constitutional:      General: She is not in acute distress. Cardiovascular:     Rate and Rhythm: Normal rate and regular rhythm.     Heart sounds: Normal heart sounds.  Pulmonary:     Effort: Pulmonary effort is normal.     Breath sounds: Normal breath sounds.  Abdominal:     General: Bowel sounds are normal.     Palpations: Abdomen is soft.  Skin:    General: Skin is warm and dry.  Neurological:     Mental Status: She is alert and oriented to person, place, and time.    Breast exam was performed in seated and lying down position. Patient is status post right lumpectomy with a well-healed surgical scar. No evidence of any palpable masses. No evidence of axillary adenopathy. No evidence of any palpable masses or lumps in the left breast. No evidence of leftt axillary adenopathy   CMP Latest Ref Rng & Units 06/12/2020  Glucose 65 - 99 mg/dL 96  BUN 8 - 27 mg/dL 14  Creatinine 0.57 - 1.00 mg/dL 0.76  Sodium 134 - 144 mmol/L 143  Potassium 3.5 - 5.2 mmol/L 4.1   Chloride 96 - 106 mmol/L 104  CO2 20 - 29 mmol/L 28  Calcium 8.7 - 10.3 mg/dL 9.6  Total Protein 6.0 - 8.5 g/dL 7.2  Total Bilirubin 0.0 - 1.2 mg/dL 0.4  Alkaline Phos 44 - 121 IU/L 94  AST 0 - 40 IU/L 32  ALT 0 - 32 IU/L 25   CBC Latest Ref Rng & Units 09/23/2020  WBC 3.4 - 10.8 x10E3/uL 4.9  Hemoglobin 11.1 - 15.9 g/dL 12.7  Hematocrit 34.0 - 46.6 % 38.2  Platelets 150 - 450 x10E3/uL 270      Assessment and plan- Patient is a 65 y.o. female with history of right breast cancer ER/PR positive HER2 negative in 2019 s/p surgery, adjuvant chemotherapy and radiation treatment.  She is currently on Arimidex and this is a routine follow-up visit  Clinically patient is doing  well with no concerning signs and symptoms of recurrence based on today's exam.  Mammogram from February 2022 was unremarkable.  She will take Arimidex at least for 5 years if not 10 years.  I advised her to take calcium and vitamin D thousand 200 mg and 800 international units respectively.  Baseline bone density scan did show some osteopenia in the left femur neck with a T score of -1.1.  She does not require any bisphosphonates at this time.  I had previously discussed adjuvant bisphosphonates for high risk breast cancer but she did not wish to proceed with that.  I will see her back in 6 months no labs   Visit Diagnosis 1. Visit for monitoring Arimidex therapy   2. Encounter for follow-up surveillance of breast cancer      Dr. Randa Evens, MD, MPH [facility] [phone] 12/13/2020 4:23 PM

## 2021-03-06 ENCOUNTER — Ambulatory Visit: Payer: Self-pay | Admitting: *Deleted

## 2021-03-06 NOTE — Telephone Encounter (Signed)
Pt called initially to report diarrhea. Reports onset 2 days ago. States 2-4 times a day "Mostly in mornings." Denies fever, no nausea or vomiting, no abdominal pain. States is staying hydrated. States husband also has diarrhea. Also reports UTI symptoms, onset today. Reports dysuria, urgency and frequency.  Called practice, [facility], as protocol timeframe calls for appt within 24hrs. Will route to practice for review. Care advise given to patient. Assured pt NT would route to practice for PCPs review and final disposition. Advised ED for worsening symptoms. Pt verbalizes understanding.   CB# [phone]

## 2021-03-06 NOTE — Telephone Encounter (Signed)
Called pt scheduled an appt for tomorrow 03/07/21.  KP

## 2021-03-06 NOTE — Telephone Encounter (Signed)
The patient has experienced diarrhea for the past two days   The patient shares that their spouse has previous experienced similar symptoms for the past 6 days   The patient would like to discuss further when possible  Reason for Disposition  [1] MODERATE diarrhea (e.g., 4-6 times / day more than normal) AND [2] present > 48 hours (2 days)  Answer Assessment - Initial Assessment Questions 1. DIARRHEA SEVERITY: "How bad is the diarrhea?" "How many more stools have you had in the past 24 hours than normal?"    - NO DIARRHEA (SCALE 0)   - MILD (SCALE 1-3): Few loose or mushy BMs; increase of 1-3 stools over normal daily number of stools; mild increase in ostomy output.   -  MODERATE (SCALE 4-7): Increase of 4-6 stools daily over normal; moderate increase in ostomy output. * SEVERE (SCALE 8-10; OR 'WORST POSSIBLE'): Increase of 7 or more stools daily over normal; moderate increase in ostomy output; incontinence.     Moderate 2. ONSET: "When did the diarrhea begin?"      2 days ago 3. BM CONSISTENCY: "How loose or watery is the diarrhea?"      "Little form" 4. VOMITING: "Are you also vomiting?" If Yes, ask: "How many times in the past 24 hours?"      No 5. ABDOMINAL PAIN: "Are you having any abdominal pain?" If Yes, ask: "What does it feel like?" (e.g., crampy, dull, intermittent, constant)      No 6. ABDOMINAL PAIN SEVERITY: If present, ask: "How bad is the pain?"  (e.g., Scale 1-10; mild, moderate, or severe)   - MILD (1-3): doesn't interfere with normal activities, abdomen soft and not tender to touch    - MODERATE (4-7): interferes with normal activities or awakens from sleep, abdomen tender to touch    - SEVERE (8-10): excruciating pain, doubled over, unable to do any normal activities       NA 7. ORAL INTAKE: If vomiting, "Have you been able to drink liquids?" "How much liquids have you had in the past 24 hours?"     Yes, no vomiting 8. HYDRATION: "Any signs of dehydration?" (e.g., dry  mouth [not just dry lips], too weak to stand, dizziness, new weight loss) "When did you last urinate?"     "Probably could drink more water." No S/S dehydration. 9. EXPOSURE: "Have you traveled to a foreign country recently?" "Have you been exposed to anyone with diarrhea?" "Could you have eaten any food that was spoiled?"     no 10. ANTIBIOTIC USE: "Are you taking antibiotics now or have you taken antibiotics in the past 2 months?"       no 11. OTHER SYMPTOMS: "Do you have any other symptoms?" (e.g., fever, blood in stool)      UTI symptoms. Dysuria, frequency, urgency, onset today.  Protocols used: [facility]

## 2021-03-07 ENCOUNTER — Encounter: Payer: Self-pay | Admitting: Family Medicine

## 2021-03-07 ENCOUNTER — Other Ambulatory Visit: Payer: Self-pay

## 2021-03-07 ENCOUNTER — Ambulatory Visit (INDEPENDENT_AMBULATORY_CARE_PROVIDER_SITE_OTHER): Payer: 59 | Admitting: Family Medicine

## 2021-03-07 VITALS — BP 114/72 | HR 75 | Temp 98.3°F | Ht 65.5 in | Wt 184.0 lb

## 2021-03-07 DIAGNOSIS — R8290 Unspecified abnormal findings in urine: Secondary | ICD-10-CM

## 2021-03-07 DIAGNOSIS — N3091 Cystitis, unspecified with hematuria: Secondary | ICD-10-CM | POA: Insufficient documentation

## 2021-03-07 DIAGNOSIS — R399 Unspecified symptoms and signs involving the genitourinary system: Secondary | ICD-10-CM | POA: Insufficient documentation

## 2021-03-07 LAB — POCT URINALYSIS DIPSTICK
Bilirubin, UA: NEGATIVE
Glucose, UA: NEGATIVE
Ketones, UA: NEGATIVE
Leukocytes, UA: NEGATIVE
Nitrite, UA: POSITIVE
Protein, UA: NEGATIVE
Spec Grav, UA: 1.01 (ref 1.010–1.025)
Urobilinogen, UA: 0.2 E.U./dL
pH, UA: 7 (ref 5.0–8.0)

## 2021-03-07 MED ORDER — NITROFURANTOIN MONOHYD MACRO 100 MG PO CAPS: 100.0000 mg | ORAL_CAPSULE | Freq: Two times a day (BID) | ORAL | 0 refills | Status: DC

## 2021-03-07 MED ORDER — FLUCONAZOLE 150 MG PO TABS
150.0000 mg | ORAL_TABLET | Freq: Once | ORAL | 0 refills | Status: AC
Start: 2021-03-07 — End: 2021-03-07

## 2021-03-07 NOTE — Assessment & Plan Note (Signed)
Patient with 1 day history of dysuria, urgency, frequency in the setting few day history of diarrhea.  She had trialed OTC Azo medication with improvement in symptoms.  Examination today reveals benign cardiopulmonary and abdominal examinations, no CVA tenderness, suprapubic region nontender.  Urinalysis obtained and positive for blood and nitrites, this will be sent for culture, and patient to initiate Macrobid scheduled and Diflucan if the symptoms develop.  Supportive care advised as well as following up as needed.  Will contact patient with culture results and modification pharmacotherapy if indicated.  She can otherwise follow-up as needed.

## 2021-03-07 NOTE — Assessment & Plan Note (Signed)
See additional assessment(s) for plan details. 

## 2021-03-07 NOTE — Patient Instructions (Signed)
-   Dose Macrobid for full course - Can dose diflucan if yeast noted - Drink plenty of water and get rest - Can dose probiotic if stomach/GI symptoms worsen - Contact for any worsening symptoms - We will contact you with urine culture results - Follow-up as-needed

## 2021-03-07 NOTE — Progress Notes (Signed)
Primary Care / Sports Medicine Office Visit  Patient Information:  Patient ID: Alexa Knight, female DOB: 07/13/1956 Age: 65 y.o. MRN: [id number]   Alexa Knight is a pleasant 65 y.o. female presenting with the following:  Chief Complaint  Patient presents with   Urinary Symptoms    Started yesterday; dysuria, urgency, frequency; has taken over the counter Azo with some relief    Review of Systems pertinent details above   Patient Active Problem List   Diagnosis Date Noted   UTI symptoms 03/07/2021   Hemorrhagic cystitis 03/07/2021   ANA positive 10/30/2020   Rheumatoid factor positive 10/30/2020   Mixed hyperlipidemia 10/05/2020   Raynaud's syndrome 10/04/2020   Multinodular goiter 06/12/2020   Pharyngeal dysphagia 06/12/2020   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 in female, estrogen receptor positive ([place]) 07/28/2018   Past Medical History:  Diagnosis Date   Breast cancer ([place])    Cancer ([place]) 2019   Right breast   Personal history of chemotherapy 2019   Rt breast   Personal history of radiation therapy 2019   Rt breast   Thyroid disease    Outpatient Encounter Medications as of 03/07/2021  Medication Sig   anastrozole (ARIMIDEX) 1 MG tablet Take 1 tablet (1 mg total) by mouth daily.   Calcium Carbonate (CALCIUM 500 PO) Take 1,000 mg by mouth daily.   fluconazole (DIFLUCAN) 150 MG tablet Take 1 tablet (150 mg total) by mouth once for 1 dose.   levothyroxine (SYNTHROID) 100 MCG tablet Take 100 mcg by mouth daily before breakfast.    Multiple Vitamin (MULTI-VITAMIN DAILY PO) Take by mouth.   nitrofurantoin, macrocrystal-monohydrate, (MACROBID) 100 MG capsule Take 1 capsule (100 mg total) by mouth 2 (two) times daily.   No facility-administered encounter medications on file as of 03/07/2021.   Past Surgical History:  Procedure Laterality Date   BREAST BIOPSY Left    stereo-neg   BREAST LUMPECTOMY Right 2019   invasive ductal carcinoma    Vitals:    03/07/21 0752  BP: 114/72  Pulse: 75  Temp: 98.3 F (36.8 C)  SpO2: 95%   Vitals:   03/07/21 0752  Weight: 184 lb (83.5 kg)  Height: 5' 5.5" (1.664 m)   Body mass index is 30.15 kg/m.  No results found.   Independent interpretation of notes and tests performed by another provider:   None  Procedures performed:   None  Pertinent History, Exam, Impression, and Recommendations:   UTI symptoms See additional assessment(s) for plan details.  Hemorrhagic cystitis Patient with 1 day history of dysuria, urgency, frequency in the setting few day history of diarrhea.  She had trialed OTC Azo medication with improvement in symptoms.  Examination today reveals benign cardiopulmonary and abdominal examinations, no CVA tenderness, suprapubic region nontender.  Urinalysis obtained and positive for blood and nitrites, this will be sent for culture, and patient to initiate Macrobid scheduled and Diflucan if the symptoms develop.  Supportive care advised as well as following up as needed.  Will contact patient with culture results and modification pharmacotherapy if indicated.  She can otherwise follow-up as needed.    Orders & Medications Meds ordered this encounter  Medications   nitrofurantoin, macrocrystal-monohydrate, (MACROBID) 100 MG capsule    Sig: Take 1 capsule (100 mg total) by mouth 2 (two) times daily.    Dispense:  14 capsule    Refill:  0   fluconazole (DIFLUCAN) 150 MG tablet    Sig: Take 1 tablet (  150 mg total) by mouth once for 1 dose.    Dispense:  1 tablet    Refill:  0   Orders Placed This Encounter  Procedures   Urine Culture   POCT Urinalysis Dipstick     Return if symptoms worsen or fail to improve.     Montel Culver, MD   Primary Care Sports Medicine [place]

## 2021-03-09 LAB — URINE CULTURE

## 2021-03-09 LAB — SPECIMEN STATUS REPORT

## 2021-06-16 ENCOUNTER — Encounter: Payer: Self-pay | Admitting: Oncology

## 2021-06-16 ENCOUNTER — Other Ambulatory Visit: Payer: Self-pay

## 2021-06-16 ENCOUNTER — Inpatient Hospital Stay: Payer: 59 | Attending: Oncology | Admitting: Oncology

## 2021-06-16 VITALS — BP 122/89 | HR 63 | Temp 97.4°F | Resp 18 | Wt 188.6 lb

## 2021-06-16 DIAGNOSIS — Z87891 Personal history of nicotine dependence: Secondary | ICD-10-CM | POA: Diagnosis not present

## 2021-06-16 DIAGNOSIS — Z923 Personal history of irradiation: Secondary | ICD-10-CM | POA: Insufficient documentation

## 2021-06-16 DIAGNOSIS — Z5181 Encounter for therapeutic drug level monitoring: Secondary | ICD-10-CM

## 2021-06-16 DIAGNOSIS — C50911 Malignant neoplasm of unspecified site of right female breast: Secondary | ICD-10-CM | POA: Diagnosis not present

## 2021-06-16 DIAGNOSIS — Z170 Estrogen receptor positive status [ER+]: Secondary | ICD-10-CM | POA: Diagnosis not present

## 2021-06-16 DIAGNOSIS — Z9221 Personal history of antineoplastic chemotherapy: Secondary | ICD-10-CM | POA: Insufficient documentation

## 2021-06-16 DIAGNOSIS — Z08 Encounter for follow-up examination after completed treatment for malignant neoplasm: Secondary | ICD-10-CM

## 2021-06-16 DIAGNOSIS — Z79899 Other long term (current) drug therapy: Secondary | ICD-10-CM | POA: Insufficient documentation

## 2021-06-16 DIAGNOSIS — E063 Autoimmune thyroiditis: Secondary | ICD-10-CM | POA: Insufficient documentation

## 2021-06-16 DIAGNOSIS — Z79811 Long term (current) use of aromatase inhibitors: Secondary | ICD-10-CM | POA: Diagnosis not present

## 2021-06-16 DIAGNOSIS — Z853 Personal history of malignant neoplasm of breast: Secondary | ICD-10-CM | POA: Diagnosis not present

## 2021-06-16 NOTE — Progress Notes (Signed)
Pt will like to discuss pain underneath her armpit; some tenderness in her rt breast area where surgery occurred.

## 2021-06-16 NOTE — Progress Notes (Signed)
Hematology/Oncology Consult note [facility]  [phone] [phone]  Patient Care Team: Glean Hess, MD as PCP - General (Internal Medicine) Lonia Farber, MD as Consulting Physician (Endocrinology) Sindy Guadeloupe, MD as Consulting Physician (Oncology)   Name of the patient: Alexa Knight  [id number]  [birth year]   Date of visit: 06/16/21  Diagnosis- history of stage I right breast cancer in 2019 currently on Arimidex  Chief complaint/ Reason for visit-routine follow-up visit of breast cancer  Heme/Onc history: Patient is a 65 year old female who was diagnosed with stage I right breast cancer in 2019.  She had T1CN1A IDC of the right breast that was ER/PR positive and HER-2/neu negative.  Core needle biopsy had showed grade 1 ER 100% positive PR 10% positive and HER-2 negative tumor with a Ki-67 of 5%.  In October 2019 she underwent right lumpectomy with sentinel lymph node dissection which showed a 1.5 cm tumor with positive LVI and 2 out of 2 sentinel lymph nodes that were positive with no evidence of extra nuclear extension.  She received adjuvant dose dense AC chemotherapy and weekly Taxol between December 2019 to May 2020.  She then completed adjuvant radiation therapy and started Arimidex in July 2020.  Patient also has history of Hashimoto's thyroiditis for which she is on levothyroxine.  She was being followed by Dr. Frederic Jericho in [place] and patient has now relocated to [facility].    Interval history-patient reports occasional pain at the lumpectomy site as well as in the right axilla when she tries to reach out to an object.  Pain is not persistent.  Appetite and weight have remained stable.  Has ongoing ankle pain but denies any obvious trauma to the ankle.  ECOG PS- 1 Pain scale- 0   Review of systems- Review of Systems  Constitutional:  Negative for chills, fever, malaise/fatigue and weight loss.   HENT:  Negative for congestion, ear discharge and nosebleeds.   Eyes:  Negative for blurred vision.  Respiratory:  Negative for cough, hemoptysis, sputum production, shortness of breath and wheezing.   Cardiovascular:  Negative for chest pain, palpitations, orthopnea and claudication.  Gastrointestinal:  Negative for abdominal pain, blood in stool, constipation, diarrhea, heartburn, melena, nausea and vomiting.  Genitourinary:  Negative for dysuria, flank pain, frequency, hematuria and urgency.  Musculoskeletal:  Negative for back pain, joint pain and myalgias.  Skin:  Negative for rash.  Neurological:  Negative for dizziness, tingling, focal weakness, seizures, weakness and headaches.  Endo/Heme/Allergies:  Does not bruise/bleed easily.  Psychiatric/Behavioral:  Negative for depression and suicidal ideas. The patient does not have insomnia.     No Known Allergies   Past Medical History:  Diagnosis Date   Breast cancer ([place])    Cancer ([place]) 2019   Right breast   Personal history of chemotherapy 2019   Rt breast   Personal history of radiation therapy 2019   Rt breast   Thyroid disease      Past Surgical History:  Procedure Laterality Date   BREAST BIOPSY Left    stereo-neg   BREAST LUMPECTOMY Right 2019   invasive ductal carcinoma    Social History   Socioeconomic History   Marital status: Married    Spouse name: Cindie Laroche   Number of children: 2   Years of education: Not on file   Highest education level: Not on file  Occupational History   Not on file  Tobacco Use  Smoking status: Former    Packs/day: 1.00    Years: 26.00    Pack years: 26.00    Types: Cigarettes    Quit date: 2000    Years since quitting: 22.9   Smokeless tobacco: Never  Vaping Use   Vaping Use: Never used  Substance and Sexual Activity   Alcohol use: Not Currently   Drug use: Never   Sexual activity: Not Currently    Partners: Male  Other Topics Concern   Not on file  Social  History Narrative   Not on file   Social Determinants of Health   Financial Resource Strain: Not on file  Food Insecurity: Not on file  Transportation Needs: Not on file  Physical Activity: Not on file  Stress: Not on file  Social Connections: Not on file  Intimate Partner Violence: Not on file    Family History  Problem Relation Age of Onset   Breast cancer Mother 37   Breast cancer Maternal Aunt    Heart attack Father      Current Outpatient Medications:    anastrozole (ARIMIDEX) 1 MG tablet, Take 1 tablet (1 mg total) by mouth daily., Disp: 90 tablet, Rfl: 3   Calcium Carbonate (CALCIUM 500 PO), Take 1,000 mg by mouth daily., Disp: , Rfl:    levothyroxine (SYNTHROID) 100 MCG tablet, Take 100 mcg by mouth daily before breakfast. , Disp: , Rfl:    Multiple Vitamin (MULTI-VITAMIN DAILY PO), Take by mouth., Disp: , Rfl:    ondansetron (ZOFRAN) 8 MG tablet, Take by mouth. (Patient not taking: Reported on 06/16/2021), Disp: , Rfl:   Physical exam:  Vitals:   06/16/21 1020  BP: 122/89  Pulse: 63  Resp: 18  Temp: (!) 97.4 F (36.3 C)  SpO2: 99%  Weight: 188 lb 9.6 oz (85.5 kg)   Physical Exam Constitutional:      General: She is not in acute distress. Cardiovascular:     Rate and Rhythm: Normal rate and regular rhythm.     Heart sounds: Normal heart sounds.  Pulmonary:     Effort: Pulmonary effort is normal.     Breath sounds: Normal breath sounds.  Abdominal:     General: Bowel sounds are normal.     Palpations: Abdomen is soft.  Skin:    General: Skin is warm and dry.  Neurological:     Mental Status: She is alert and oriented to person, place, and time.   Breast exam was performed in seated and lying down position. Patient is status post right lumpectomy with a well-healed surgical scar. No evidence of any palpable masses. No evidence of axillary adenopathy. No evidence of any palpable masses or lumps in the left breast. No evidence of leftt axillary  adenopathy   CMP Latest Ref Rng & Units 06/12/2020  Glucose 65 - 99 mg/dL 96  BUN 8 - 27 mg/dL 14  Creatinine 0.57 - 1.00 mg/dL 0.76  Sodium 134 - 144 mmol/L 143  Potassium 3.5 - 5.2 mmol/L 4.1  Chloride 96 - 106 mmol/L 104  CO2 20 - 29 mmol/L 28  Calcium 8.7 - 10.3 mg/dL 9.6  Total Protein 6.0 - 8.5 g/dL 7.2  Total Bilirubin 0.0 - 1.2 mg/dL 0.4  Alkaline Phos 44 - 121 IU/L 94  AST 0 - 40 IU/L 32  ALT 0 - 32 IU/L 25   CBC Latest Ref Rng & Units 09/23/2020  WBC 3.4 - 10.8 x10E3/uL 4.9  Hemoglobin 11.1 - 15.9 g/dL 12.7  Hematocrit 34.0 - 46.6 % 38.2  Platelets 150 - 450 x10E3/uL 270    Assessment and plan- Patient is a 65 y.o. female with history of right breast cancer ER/PR positive HER2 negative in 2019 s/p surgery, adjuvant chemotherapy and radiation treatment.  She is currently on Arimidex and this is a routine follow-up visit for breast cancer  Clinically patient is doing well with no concerning signs and symptoms of recurrence based on today's exam.  Her breast exam was unremarkable.  Occasional pain at the site of lumpectomy is not unusual after surgery.  She is tolerating Arimidex along with calcium and vitamin D well.  She would be due for a mammogram in March 2023 which I will schedule.  I will see her back in 6 months no labs   Visit Diagnosis 1. Encounter for follow-up surveillance of breast cancer   2. Encounter for monitoring anastrozole therapy      Dr. Randa Evens, MD, MPH [facility] at [facility] [phone] 06/16/2021 4:30 PM

## 2021-10-08 ENCOUNTER — Encounter: Payer: Self-pay | Admitting: Internal Medicine

## 2021-10-08 ENCOUNTER — Ambulatory Visit (INDEPENDENT_AMBULATORY_CARE_PROVIDER_SITE_OTHER): Payer: 59 | Admitting: Internal Medicine

## 2021-10-08 ENCOUNTER — Other Ambulatory Visit: Payer: Self-pay

## 2021-10-08 VITALS — BP 116/70 | HR 60 | Ht 65.5 in | Wt 184.8 lb

## 2021-10-08 DIAGNOSIS — C50411 Malignant neoplasm of upper-outer quadrant of right female breast: Secondary | ICD-10-CM

## 2021-10-08 DIAGNOSIS — E782 Mixed hyperlipidemia: Secondary | ICD-10-CM

## 2021-10-08 DIAGNOSIS — R768 Other specified abnormal immunological findings in serum: Secondary | ICD-10-CM | POA: Diagnosis not present

## 2021-10-08 DIAGNOSIS — Z23 Encounter for immunization: Secondary | ICD-10-CM | POA: Diagnosis not present

## 2021-10-08 DIAGNOSIS — K219 Gastro-esophageal reflux disease without esophagitis: Secondary | ICD-10-CM | POA: Diagnosis not present

## 2021-10-08 DIAGNOSIS — Z0000 Encounter for general adult medical examination without abnormal findings: Secondary | ICD-10-CM

## 2021-10-08 DIAGNOSIS — Z1211 Encounter for screening for malignant neoplasm of colon: Secondary | ICD-10-CM

## 2021-10-08 DIAGNOSIS — E042 Nontoxic multinodular goiter: Secondary | ICD-10-CM

## 2021-10-08 DIAGNOSIS — Z170 Estrogen receptor positive status [ER+]: Secondary | ICD-10-CM | POA: Diagnosis not present

## 2021-10-08 MED ORDER — OMEPRAZOLE 20 MG PO CPDR: 20.0000 mg | DELAYED_RELEASE_CAPSULE | Freq: Every day | ORAL | 0 refills | Status: DC

## 2021-10-08 NOTE — Progress Notes (Signed)
Date:  10/08/2021   Name:  Alexa Knight   DOB:  05/23/1956   MRN:  [id number]   Chief Complaint: Annual Exam (Breast Exam. No pap. ) Alexa Knight is a 66 y.o. female who presents today for her Complete Annual Exam. She feels well. She reports exercising - gardening. She reports she is sleeping well. Breast complaints - none.  Mammogram: 09/2020 - scheduled DEXA: 09/2020 osteopenia Pap smear: 09/2020  Colonoscopy: FIT 09/2020  Immunization History  Administered Date(s) Administered   Influenza,inj,Quad PF,6+ Mos 05/30/2018, 06/12/2020   PFIZER(Purple Top)SARS-COV-2 Vaccination 11/17/2019, 12/08/2019    Gastroesophageal Reflux She complains of dysphagia and heartburn. She reports no abdominal pain, no chest pain, no coughing, no hoarse voice, no nausea, no water brash or no wheezing. This is a recurrent problem. The problem occurs frequently. The heartburn duration is less than a minute. The heartburn wakes her from sleep. The heartburn does not limit her activity. The heartburn doesn't change with position. Pertinent negatives include no fatigue. Risk factors include caffeine use. She has tried an antacid for the symptoms. The treatment provided moderate relief.   Lab Results  Component Value Date   NA 143 06/12/2020   K 4.1 06/12/2020   CO2 28 06/12/2020   GLUCOSE 96 06/12/2020   BUN 14 06/12/2020   CREATININE 0.76 06/12/2020   CALCIUM 9.6 06/12/2020   GFRNONAA 84 06/12/2020   Lab Results  Component Value Date   CHOL 207 (H) 10/04/2020   HDL 45 10/04/2020   LDLCALC 132 (H) 10/04/2020   TRIG 168 (H) 10/04/2020   CHOLHDL 4.6 (H) 10/04/2020   Lab Results  Component Value Date   TSH 3.860 10/04/2020   No results found for: HGBA1C Lab Results  Component Value Date   WBC 4.9 09/23/2020   HGB 12.7 09/23/2020   HCT 38.2 09/23/2020   MCV 89 09/23/2020   PLT 270 09/23/2020   Lab Results  Component Value Date   ALT 25 06/12/2020   AST 32 06/12/2020   ALKPHOS 94  06/12/2020   BILITOT 0.4 06/12/2020   No results found for: 25OHVITD2, 25OHVITD3, VD25OH   Review of Systems  Constitutional:  Negative for chills, fatigue and fever.  HENT:  Negative for congestion, hearing loss, hoarse voice, tinnitus, trouble swallowing and voice change.   Eyes:  Negative for visual disturbance.  Respiratory:  Negative for cough, chest tightness, shortness of breath and wheezing.   Cardiovascular:  Negative for chest pain, palpitations and leg swelling.  Gastrointestinal:  Positive for dysphagia and heartburn. Negative for abdominal pain, constipation, diarrhea, nausea and vomiting.  Endocrine: Negative for polydipsia and polyuria.  Genitourinary:  Negative for dysuria, frequency, genital sores, vaginal bleeding and vaginal discharge.  Musculoskeletal:  Negative for arthralgias, gait problem and joint swelling.  Skin:  Negative for color change and rash.  Neurological:  Negative for dizziness, tremors, light-headedness and headaches.  Hematological:  Negative for adenopathy. Does not bruise/bleed easily.  Psychiatric/Behavioral:  Negative for dysphoric mood and sleep disturbance. The patient is not nervous/anxious.    Patient Active Problem List   Diagnosis Date Noted   Hemorrhagic cystitis 03/07/2021   ANA positive 10/30/2020   Rheumatoid factor positive 10/30/2020   Mixed hyperlipidemia 10/05/2020   Raynaud's syndrome 10/04/2020   Multinodular goiter 06/12/2020   Pharyngeal dysphagia 06/12/2020   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 in female, estrogen receptor positive (HCC) 07/28/2018    No Known Allergies  Past Surgical History:  Procedure Laterality Date  BREAST BIOPSY Left    stereo-neg   BREAST LUMPECTOMY Right 2019   invasive ductal carcinoma    Social History   Tobacco Use   Smoking status: Former    Packs/day: 1.00    Years: 26.00    Pack years: 26.00    Types: Cigarettes    Quit date: 2000    Years since quitting: 23.2    Smokeless tobacco: Never  Vaping Use   Vaping Use: Never used  Substance Use Topics   Alcohol use: Not Currently   Drug use: Never     Medication list has been reviewed and updated.  Current Meds  Medication Sig   anastrozole (ARIMIDEX) 1 MG tablet Take 1 tablet (1 mg total) by mouth daily.   Calcium Carbonate (CALCIUM 500 PO) Take 1,200 mg by mouth daily.   levothyroxine (SYNTHROID) 100 MCG tablet Take 100 mcg by mouth daily before breakfast.    Multiple Vitamin (MULTI-VITAMIN DAILY PO) Take by mouth.   omeprazole (PRILOSEC) 20 MG capsule Take 1 capsule (20 mg total) by mouth daily.    PHQ 2/9 Scores 10/08/2021 03/07/2021 10/04/2020 06/12/2020  PHQ - 2 Score 0 0 0 0  PHQ- 9 Score 0 0 0 0    GAD 7 : Generalized Anxiety Score 10/08/2021 03/07/2021 10/04/2020 06/12/2020  Nervous, Anxious, on Edge 0 0 0 0  Control/stop worrying 0 0 0 0  Worry too much - different things 0 0 0 0  Trouble relaxing 0 0 0 0  Restless 0 0 0 0  Easily annoyed or irritable 0 0 0 0  Afraid - awful might happen 0 0 0 0  Total GAD 7 Score 0 0 0 0  Anxiety Difficulty Not difficult at all Not difficult at all Not difficult at all Not difficult at all    BP Readings from Last 3 Encounters:  10/08/21 116/70  06/16/21 122/89  03/07/21 114/72    Physical Exam Vitals and nursing note reviewed.  Constitutional:      General: She is not in acute distress.    Appearance: She is well-developed.  HENT:     Head: Normocephalic and atraumatic.     Right Ear: Tympanic membrane and ear canal normal.     Left Ear: Tympanic membrane and ear canal normal.     Nose:     Right Sinus: No maxillary sinus tenderness.     Left Sinus: No maxillary sinus tenderness.  Eyes:     General: No scleral icterus.       Right eye: No discharge.        Left eye: No discharge.     Conjunctiva/sclera: Conjunctivae normal.  Neck:     Thyroid: No thyromegaly.     Vascular: No carotid bruit.  Cardiovascular:     Rate and  Rhythm: Normal rate and regular rhythm.     Pulses: Normal pulses.     Heart sounds: Normal heart sounds.  Pulmonary:     Effort: Pulmonary effort is normal. No respiratory distress.     Breath sounds: No wheezing.  Abdominal:     General: Bowel sounds are normal.     Palpations: Abdomen is soft.     Tenderness: There is no abdominal tenderness.  Musculoskeletal:     Cervical back: Normal range of motion. No erythema.     Right lower leg: No edema.     Left lower leg: No edema.  Lymphadenopathy:     Cervical: No cervical adenopathy.  Skin:  General: Skin is warm and dry.     Findings: No rash.  Neurological:     Mental Status: She is alert and oriented to person, place, and time.     Cranial Nerves: No cranial nerve deficit.     Sensory: No sensory deficit.     Deep Tendon Reflexes: Reflexes are normal and symmetric.  Psychiatric:        Attention and Perception: Attention normal.        Mood and Affect: Mood normal.    Wt Readings from Last 3 Encounters:  10/08/21 184 lb 12.8 oz (83.8 kg)  06/16/21 188 lb 9.6 oz (85.5 kg)  03/07/21 184 lb (83.5 kg)    BP 116/70   Pulse 60   Ht 5' 5.5" (1.664 m)   Wt 184 lb 12.8 oz (83.8 kg)   SpO2 96%   BMI 30.28 kg/m   Assessment and Plan: 1. Annual physical exam Normal exam. Continue healthy diet and exercise - CBC with Differential/Platelet - Comprehensive metabolic panel  2. Colon cancer screening - Fecal occult blood, imunochemical  3. Rheumatoid factor positive No active sx; previously evaluated by Rheumatology  4. Mixed hyperlipidemia Check labs and advise - Lipid panel  5. Gastroesophageal reflux disease, unspecified whether esophagitis present Worsening symptoms with some dysphagia Reduce caffeine and alcohol use One month trial of prilosec recommended. If no improvement, would recommend GI evaluation - omeprazole (PRILOSEC) 20 MG capsule; Take 1 capsule (20 mg total) by mouth daily.  Dispense: 30 capsule;  Refill: 0  6. Multinodular goiter Followed by Endocrinology - TSH + free T4  7. 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 in female, estrogen receptor positive (HCC) Followed by Oncology On AI Mammogram scheduled  8. Need for shingles vaccine - Varicella-zoster vaccine IM   Partially dictated using Animal nutritionist. Any errors are unintentional.  Bari Edward, MD [facility]  10/08/2021

## 2021-10-08 NOTE — Patient Instructions (Signed)
[facility]  [phone] ?

## 2021-10-09 LAB — CBC WITH DIFFERENTIAL/PLATELET
Basophils Absolute: 0 10*3/uL (ref 0.0–0.2)
Basos: 1 %
EOS (ABSOLUTE): 0.2 10*3/uL (ref 0.0–0.4)
Eos: 4 %
Hematocrit: 42.3 % (ref 34.0–46.6)
Hemoglobin: 14.1 g/dL (ref 11.1–15.9)
Immature Grans (Abs): 0 10*3/uL (ref 0.0–0.1)
Immature Granulocytes: 0 %
Lymphocytes Absolute: 1.6 10*3/uL (ref 0.7–3.1)
Lymphs: 35 %
MCH: 29.9 pg (ref 26.6–33.0)
MCHC: 33.3 g/dL (ref 31.5–35.7)
MCV: 90 fL (ref 79–97)
Monocytes Absolute: 0.6 10*3/uL (ref 0.1–0.9)
Monocytes: 12 %
Neutrophils Absolute: 2.1 10*3/uL (ref 1.4–7.0)
Neutrophils: 48 %
Platelets: 264 10*3/uL (ref 150–450)
RBC: 4.72 x10E6/uL (ref 3.77–5.28)
RDW: 12.6 % (ref 11.7–15.4)
WBC: 4.5 10*3/uL (ref 3.4–10.8)

## 2021-10-09 LAB — COMPREHENSIVE METABOLIC PANEL
ALT: 14 IU/L (ref 0–32)
AST: 21 IU/L (ref 0–40)
Albumin/Globulin Ratio: 1.4 (ref 1.2–2.2)
Albumin: 4.4 g/dL (ref 3.8–4.8)
Alkaline Phosphatase: 74 IU/L (ref 44–121)
BUN/Creatinine Ratio: 14 (ref 12–28)
BUN: 10 mg/dL (ref 8–27)
Bilirubin Total: 0.7 mg/dL (ref 0.0–1.2)
CO2: 26 mmol/L (ref 20–29)
Calcium: 9.5 mg/dL (ref 8.7–10.3)
Chloride: 102 mmol/L (ref 96–106)
Creatinine, Ser: 0.74 mg/dL (ref 0.57–1.00)
Globulin, Total: 3.1 g/dL (ref 1.5–4.5)
Glucose: 87 mg/dL (ref 70–99)
Potassium: 4.6 mmol/L (ref 3.5–5.2)
Sodium: 140 mmol/L (ref 134–144)
Total Protein: 7.5 g/dL (ref 6.0–8.5)
eGFR: 90 mL/min/{1.73_m2} (ref >59)

## 2021-10-09 LAB — LIPID PANEL
Chol/HDL Ratio: 4.5 ratio — ABNORMAL HIGH (ref 0.0–4.4)
Cholesterol, Total: 179 mg/dL (ref 100–199)
HDL: 40 mg/dL (ref >39)
LDL Chol Calc (NIH): 117 mg/dL — ABNORMAL HIGH (ref 0–99)
Triglycerides: 119 mg/dL (ref 0–149)
VLDL Cholesterol Cal: 22 mg/dL (ref 5–40)

## 2021-10-09 LAB — TSH+FREE T4
Free T4: 1.56 ng/dL (ref 0.82–1.77)
TSH: 2.89 u[IU]/mL (ref 0.450–4.500)

## 2021-10-11 LAB — FECAL OCCULT BLOOD, IMMUNOCHEMICAL

## 2021-10-11 LAB — SPECIMEN STATUS REPORT

## 2021-10-13 ENCOUNTER — Other Ambulatory Visit: Payer: Self-pay

## 2021-10-13 DIAGNOSIS — Z1211 Encounter for screening for malignant neoplasm of colon: Secondary | ICD-10-CM

## 2021-10-22 ENCOUNTER — Other Ambulatory Visit: Payer: Self-pay

## 2021-10-22 ENCOUNTER — Ambulatory Visit
Admission: RE | Admit: 2021-10-22 | Discharge: 2021-10-22 | Disposition: A | Discharge status: Home / Self Care | Payer: 59 | Source: Ambulatory Visit | Attending: Oncology | Admitting: Oncology

## 2021-10-22 DIAGNOSIS — Z08 Encounter for follow-up examination after completed treatment for malignant neoplasm: Secondary | ICD-10-CM | POA: Diagnosis not present

## 2021-10-22 DIAGNOSIS — Z853 Personal history of malignant neoplasm of breast: Secondary | ICD-10-CM | POA: Diagnosis not present

## 2021-10-22 DIAGNOSIS — Z1231 Encounter for screening mammogram for malignant neoplasm of breast: Secondary | ICD-10-CM | POA: Insufficient documentation

## 2021-10-26 ENCOUNTER — Other Ambulatory Visit: Payer: Self-pay | Admitting: Internal Medicine

## 2021-10-26 DIAGNOSIS — K219 Gastro-esophageal reflux disease without esophagitis: Secondary | ICD-10-CM

## 2021-10-28 ENCOUNTER — Other Ambulatory Visit: Payer: Self-pay | Admitting: *Deleted

## 2021-10-28 ENCOUNTER — Other Ambulatory Visit: Payer: Self-pay | Admitting: Internal Medicine

## 2021-10-28 ENCOUNTER — Other Ambulatory Visit: Payer: Self-pay

## 2021-10-28 DIAGNOSIS — Z170 Estrogen receptor positive status [ER+]: Secondary | ICD-10-CM

## 2021-10-28 MED ORDER — ANASTROZOLE 1 MG PO TABS: 1.0000 mg | ORAL_TABLET | Freq: Every day | ORAL | 3 refills | Status: DC

## 2021-10-29 NOTE — Telephone Encounter (Signed)
Refused medication due to medication being refilled 10/28/21. ? ?Requested Prescriptions  ?Pending Prescriptions Disp Refills  ?? anastrozole (ARIMIDEX) 1 MG tablet [Pharmacy Med Name: Anastrozole 1 MG Oral Tablet] 30 tablet 0  ?  Sig: Take 1 tablet by mouth once daily  ?  ? Off-Protocol Failed - 10/28/2021  9:06 AM  ?  ?  Failed - Medication not assigned to a protocol, review manually.  ?  ?  Passed - Valid encounter within last 12 months  ?  Recent Outpatient Visits   ?      ? 3 weeks ago Annual physical exam  ? [facility] Glean Hess, MD  ? 7 months ago Hemorrhagic cystitis  ? [facility] Montel Culver, MD  ? 1 year ago Annual physical exam  ? [facility] Glean Hess, MD  ? 1 year ago 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 in female, estrogen receptor positive ([place])  ? [facility] Glean Hess, MD  ?  ?  ?Future Appointments   ?        ? In 11 months Glean Hess, MD [facility], [place]  ?  ? ?  ?  ? Off-Protocol Failed - 10/28/2021  9:06 AM  ?  ?  Failed - Medication not assigned to a protocol, review manually.  ?  ?  Passed - Valid encounter within last 12 months  ?  Recent Outpatient Visits   ?      ? 3 weeks ago Annual physical exam  ? [facility] Glean [facility], MD  ? 7 months ago Hemorrhagic cystitis  ? [facility] Montel Culver, MD  ? 1 year ago Annual physical exam  ? [facility] Glean Hess, MD  ? 1 year ago 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 in female, estrogen receptor positive ([place])  ? [facility] Glean Hess, MD  ?  ?  ?Future Appointments   ?        ? In 11 months Glean Hess, MD [facility], [place]  ?  ? ?  ?  ?  ? ? ?

## 2021-11-07 ENCOUNTER — Telehealth: Payer: Self-pay | Admitting: Internal Medicine

## 2021-11-07 NOTE — Telephone Encounter (Signed)
Patient came into office and have questions for Dr. Army Melia about her [place]. Patient wants to know if she could take once a day or cut back, but doesn't want to cut back unless Dr. Army Melia says. Please call back. ?

## 2021-11-07 NOTE — Telephone Encounter (Signed)
Spoke to pt let her know that if the medication is working to take it once a day. Pt verbalized understanding. ? ?KP ?

## 2021-11-10 DIAGNOSIS — Z1211 Encounter for screening for malignant neoplasm of colon: Secondary | ICD-10-CM | POA: Diagnosis not present

## 2021-11-13 LAB — FECAL OCCULT BLOOD, IMMUNOCHEMICAL: Fecal Occult Bld: NEGATIVE

## 2021-11-22 ENCOUNTER — Other Ambulatory Visit: Payer: Self-pay | Admitting: Internal Medicine

## 2021-11-22 DIAGNOSIS — K219 Gastro-esophageal reflux disease without esophagitis: Secondary | ICD-10-CM

## 2021-11-24 NOTE — Telephone Encounter (Signed)
Requested Prescriptions  ?Pending Prescriptions Disp Refills  ?? omeprazole (PRILOSEC) 20 MG capsule [Pharmacy Med Name: Omeprazole 20 MG Oral Capsule Delayed Release] 90 capsule 3  ?  Sig: Take 1 capsule by mouth once daily  ?  ? Gastroenterology: Proton Pump Inhibitors Passed - 11/22/2021  2:24 PM  ?  ?  Passed - Valid encounter within last 12 months  ?  Recent Outpatient Visits   ?      ? 1 month ago Annual physical exam  ? [facility] Glean Hess, MD  ? 8 months ago Hemorrhagic cystitis  ? [facility] Montel Culver, MD  ? 1 year ago Annual physical exam  ? [facility] Glean Hess, MD  ? 1 year ago 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 in female, estrogen receptor positive ([place])  ? [facility] Glean Hess, MD  ?  ?  ?Future Appointments   ?        ? In 10 months Glean Hess, MD [facility], [place]  ?  ? ?  ?  ?  ? ?

## 2021-12-15 ENCOUNTER — Inpatient Hospital Stay: Payer: 59 | Attending: Oncology | Admitting: Oncology

## 2021-12-15 ENCOUNTER — Encounter: Payer: Self-pay | Admitting: Oncology

## 2021-12-15 VITALS — BP 106/80 | HR 64 | Temp 98.2°F | Resp 16 | Ht 65.5 in | Wt 176.8 lb

## 2021-12-15 DIAGNOSIS — Z08 Encounter for follow-up examination after completed treatment for malignant neoplasm: Secondary | ICD-10-CM | POA: Diagnosis not present

## 2021-12-15 DIAGNOSIS — Z79811 Long term (current) use of aromatase inhibitors: Secondary | ICD-10-CM | POA: Insufficient documentation

## 2021-12-15 DIAGNOSIS — Z9221 Personal history of antineoplastic chemotherapy: Secondary | ICD-10-CM | POA: Insufficient documentation

## 2021-12-15 DIAGNOSIS — Z923 Personal history of irradiation: Secondary | ICD-10-CM | POA: Insufficient documentation

## 2021-12-15 DIAGNOSIS — Z79899 Other long term (current) drug therapy: Secondary | ICD-10-CM | POA: Diagnosis not present

## 2021-12-15 DIAGNOSIS — C50911 Malignant neoplasm of unspecified site of right female breast: Secondary | ICD-10-CM | POA: Insufficient documentation

## 2021-12-15 DIAGNOSIS — Z170 Estrogen receptor positive status [ER+]: Secondary | ICD-10-CM | POA: Insufficient documentation

## 2021-12-15 DIAGNOSIS — Z853 Personal history of malignant neoplasm of breast: Secondary | ICD-10-CM

## 2021-12-19 NOTE — Progress Notes (Signed)
Hematology/Oncology Consult note [facility]  Telephone:([phone] [phone]  Patient Care Team: Glean Hess, MD as PCP - General (Internal Medicine) Lonia Farber, MD as Consulting Physician (Endocrinology) Sindy Guadeloupe, MD as Consulting Physician (Oncology) Quintin Alto, MD as Consulting Physician (Rheumatology)   Name of the patient: Alexa Knight  [id number]  1956-01-21   Date of visit: 12/19/21  Diagnosis- history of stage I right breast cancer in 2019 currently on Arimidex  Chief complaint/ Reason for visit-routine follow-up of breast cancer  Heme/Onc history:  Patient is a 66 year old female who was diagnosed with stage I right breast cancer in 2019.  She had T1CN1A IDC of the right breast that was ER/PR positive and HER-2/neu negative.  Core needle biopsy had showed grade 1 ER 100% positive PR 10% positive and HER-2 negative tumor with a Ki-67 of 5%.  In October 2019 she underwent right lumpectomy with sentinel lymph node dissection which showed a 1.5 cm tumor with positive LVI and 2 out of 2 sentinel lymph nodes that were positive with no evidence of extra nuclear extension.  She received adjuvant dose dense AC chemotherapy and weekly Taxol between December 2019 to May 2020.  She then completed adjuvant radiation therapy and started Arimidex in July 2020.  Patient also has history of Hashimoto's thyroiditis for which she is on levothyroxine.  She was being followed by Dr. Frederic Jericho in [place] and patient has now relocated to [facility].      Interval history-patient is doing well overall and denies any specific complaints at this time.  Denies any breast concerns.  Appetite and weight have remained stable.  ECOG PS- 0 Pain scale- 0   Review of systems- Review of Systems  Constitutional:  Negative for chills, fever, malaise/fatigue and weight loss.  HENT:  Negative for congestion, ear discharge and  nosebleeds.   Eyes:  Negative for blurred vision.  Respiratory:  Negative for cough, hemoptysis, sputum production, shortness of breath and wheezing.   Cardiovascular:  Negative for chest pain, palpitations, orthopnea and claudication.  Gastrointestinal:  Negative for abdominal pain, blood in stool, constipation, diarrhea, heartburn, melena, nausea and vomiting.  Genitourinary:  Negative for dysuria, flank pain, frequency, hematuria and urgency.  Musculoskeletal:  Negative for back pain, joint pain and myalgias.  Skin:  Negative for rash.  Neurological:  Negative for dizziness, tingling, focal weakness, seizures, weakness and headaches.  Endo/Heme/Allergies:  Does not bruise/bleed easily.  Psychiatric/Behavioral:  Negative for depression and suicidal ideas. The patient does not have insomnia.       No Known Allergies   Past Medical History:  Diagnosis Date   Breast cancer ([place])    Cancer ([place]) 2019   Right breast   Personal history of chemotherapy 2019   Rt breast   Personal history of radiation therapy 2019   Rt breast   Thyroid disease      Past Surgical History:  Procedure Laterality Date   BREAST BIOPSY Left    stereo-neg   BREAST LUMPECTOMY Right 2019   invasive ductal carcinoma    Social History   Socioeconomic History   Marital status: Married    Spouse name: Cindie Laroche   Number of children: 2   Years of education: Not on file   Highest education level: Not on file  Occupational History   Not on file  Tobacco Use   Smoking status: Former    Packs/day: 1.00  Years: 26.00    Pack years: 26.00    Types: Cigarettes    Quit date: 2000    Years since quitting: 23.4   Smokeless tobacco: Never  Vaping Use   Vaping Use: Never used  Substance and Sexual Activity   Alcohol use: Yes    Comment: once every 2 weeks   Drug use: Never   Sexual activity: Not Currently    Partners: Male  Other Topics Concern   Not on file  Social History Narrative   Not on  file   Social Determinants of Health   Financial Resource Strain: Not on file  Food Insecurity: Not on file  Transportation Needs: Not on file  Physical Activity: Not on file  Stress: Not on file  Social Connections: Not on file  Intimate Partner Violence: Not on file    Family History  Problem Relation Age of Onset   Breast cancer Mother 67   Breast cancer Maternal Aunt    Heart attack Father      Current Outpatient Medications:    anastrozole (ARIMIDEX) 1 MG tablet, Take 1 tablet (1 mg total) by mouth daily., Disp: 90 tablet, Rfl: 3   Calcium Carbonate (CALCIUM 500 PO), Take 1,200 mg by mouth daily., Disp: , Rfl:    levothyroxine (SYNTHROID) 100 MCG tablet, Take 100 mcg by mouth daily before breakfast. , Disp: , Rfl:    Multiple Vitamin (MULTI-VITAMIN DAILY PO), Take 1 tablet by mouth daily., Disp: , Rfl:    omeprazole (PRILOSEC) 20 MG capsule, Take 1 capsule by mouth once daily, Disp: 90 capsule, Rfl: 3  Physical exam:  Vitals:   12/15/21 1311  BP: 106/80  Pulse: 64  Resp: 16  Temp: 98.2 F (36.8 C)  TempSrc: Oral  Weight: 176 lb 12.8 oz (80.2 kg)  Height: 5' 5.5" (1.664 m)   Physical Exam Constitutional:      General: She is not in acute distress. Cardiovascular:     Rate and Rhythm: Normal rate and regular rhythm.     Heart sounds: Normal heart sounds.  Pulmonary:     Effort: Pulmonary effort is normal.     Breath sounds: Normal breath sounds.  Abdominal:     General: Bowel sounds are normal.     Palpations: Abdomen is soft.  Skin:    General: Skin is warm and dry.  Neurological:     Mental Status: She is alert and oriented to person, place, and time.   Breast exam was performed in seated and lying down position. Patient is status post right lumpectomy with a well-healed surgical scar. No evidence of any palpable masses. No evidence of axillary adenopathy. No evidence of any palpable masses or lumps in the left breast. No evidence of leftt axillary  adenopathy      Latest Ref Rng & Units 10/08/2021    9:24 AM  CMP  Glucose 70 - 99 mg/dL 87    BUN 8 - 27 mg/dL 10    Creatinine 0.57 - 1.00 mg/dL 0.74    Sodium 134 - 144 mmol/L 140    Potassium 3.5 - 5.2 mmol/L 4.6    Chloride 96 - 106 mmol/L 102    CO2 20 - 29 mmol/L 26    Calcium 8.7 - 10.3 mg/dL 9.5    Total Protein 6.0 - 8.5 g/dL 7.5    Total Bilirubin 0.0 - 1.2 mg/dL 0.7    Alkaline Phos 44 - 121 IU/L 74    AST 0 -  40 IU/L 21    ALT 0 - 32 IU/L 14        Latest Ref Rng & Units 10/08/2021    9:24 AM  CBC  WBC 3.4 - 10.8 x10E3/uL 4.5    Hemoglobin 11.1 - 15.9 g/dL 14.1    Hematocrit 34.0 - 46.6 % 42.3    Platelets 150 - 450 x10E3/uL 264       Assessment and plan- Patient is a 66 y.o. female with history of right breast cancer ER/PR positive HER2 negative in 2019 s/p surgery, adjuvant chemotherapy and radiation treatment.  She is currently on Arimidex and this is a routine follow-up visit  Clinically patient is doing well with no concerning signs and symptoms of recurrence based on today's exam.  She had a screening mammogram in March 2023 which was unremarkable.  I will see her back in 6 months no labs.  We will also schedule a mammogram for her in March 2024.   Visit Diagnosis 1. Encounter for follow-up surveillance of breast cancer      Dr. Randa Evens, MD, MPH [facility] [phone] 12/19/2021 6:01 PM

## 2022-01-28 ENCOUNTER — Ambulatory Visit (INDEPENDENT_AMBULATORY_CARE_PROVIDER_SITE_OTHER): Payer: 59

## 2022-01-28 DIAGNOSIS — Z23 Encounter for immunization: Secondary | ICD-10-CM

## 2022-06-08 ENCOUNTER — Other Ambulatory Visit: Payer: Self-pay

## 2022-06-08 ENCOUNTER — Encounter: Payer: Self-pay | Admitting: Oncology

## 2022-06-08 ENCOUNTER — Inpatient Hospital Stay: Payer: Medicare Other | Attending: Oncology | Admitting: Oncology

## 2022-06-08 VITALS — BP 119/81 | HR 68 | Temp 96.7°F | Resp 16 | Wt 183.5 lb

## 2022-06-08 DIAGNOSIS — Z79811 Long term (current) use of aromatase inhibitors: Secondary | ICD-10-CM

## 2022-06-08 DIAGNOSIS — Z170 Estrogen receptor positive status [ER+]: Secondary | ICD-10-CM | POA: Diagnosis not present

## 2022-06-08 DIAGNOSIS — E063 Autoimmune thyroiditis: Secondary | ICD-10-CM | POA: Diagnosis not present

## 2022-06-08 DIAGNOSIS — M85859 Other specified disorders of bone density and structure, unspecified thigh: Secondary | ICD-10-CM

## 2022-06-08 DIAGNOSIS — Z79899 Other long term (current) drug therapy: Secondary | ICD-10-CM | POA: Insufficient documentation

## 2022-06-08 DIAGNOSIS — Z5181 Encounter for therapeutic drug level monitoring: Secondary | ICD-10-CM | POA: Diagnosis not present

## 2022-06-08 DIAGNOSIS — C50411 Malignant neoplasm of upper-outer quadrant of right female breast: Secondary | ICD-10-CM | POA: Diagnosis not present

## 2022-06-08 DIAGNOSIS — Z87891 Personal history of nicotine dependence: Secondary | ICD-10-CM | POA: Insufficient documentation

## 2022-06-08 DIAGNOSIS — Z923 Personal history of irradiation: Secondary | ICD-10-CM | POA: Insufficient documentation

## 2022-06-08 DIAGNOSIS — K219 Gastro-esophageal reflux disease without esophagitis: Secondary | ICD-10-CM

## 2022-06-08 DIAGNOSIS — Z08 Encounter for follow-up examination after completed treatment for malignant neoplasm: Secondary | ICD-10-CM | POA: Diagnosis not present

## 2022-06-08 DIAGNOSIS — Z853 Personal history of malignant neoplasm of breast: Secondary | ICD-10-CM

## 2022-06-08 DIAGNOSIS — Z9221 Personal history of antineoplastic chemotherapy: Secondary | ICD-10-CM | POA: Diagnosis not present

## 2022-06-08 DIAGNOSIS — Z803 Family history of malignant neoplasm of breast: Secondary | ICD-10-CM | POA: Diagnosis not present

## 2022-06-08 DIAGNOSIS — Z8249 Family history of ischemic heart disease and other diseases of the circulatory system: Secondary | ICD-10-CM | POA: Diagnosis not present

## 2022-06-08 MED ORDER — ANASTROZOLE 1 MG PO TABS: 1.0000 mg | ORAL_TABLET | Freq: Every day | ORAL | 3 refills | Status: DC

## 2022-06-08 MED ORDER — OMEPRAZOLE 20 MG PO CPDR: 20.0000 mg | DELAYED_RELEASE_CAPSULE | Freq: Every day | ORAL | 3 refills | Status: DC

## 2022-06-08 NOTE — Progress Notes (Signed)
Hematology/Oncology Consult note [facility]  [phone] [phone]  Patient Care Team: Glean Hess, MD as PCP - General (Internal Medicine) Lonia Farber, MD as Consulting Physician (Endocrinology) Sindy Guadeloupe, MD as Consulting Physician (Oncology) Quintin Alto, MD as Consulting Physician (Rheumatology)   Name of the patient: Alexa Knight  [id number]  10-Jun-1956   Date of visit: 06/08/22  Diagnosis- history of stage I right breast cancer in 2019 currently on Arimidex    Chief complaint/ Reason for visit-routine follow-up of breast cancer  Heme/Onc history: Patient is a 66 year old female who was diagnosed with stage I right breast cancer in 2019.  She had T1CN1A IDC of the right breast that was ER/PR positive and HER-2/neu negative.  Core needle biopsy had showed grade 1 ER 100% positive PR 10% positive and HER-2 negative tumor with a Ki-67 of 5%.  In October 2019 she underwent right lumpectomy with sentinel lymph node dissection which showed a 1.5 cm tumor with positive LVI and 2 out of 2 sentinel lymph nodes that were positive with no evidence of extra nuclear extension.  She received adjuvant dose dense AC chemotherapy and weekly Taxol between December 2019 to May 2020.  She then completed adjuvant radiation therapy and started Arimidex in July 2020.  Patient also has history of Hashimoto's thyroiditis for which she is on levothyroxine.  She was being followed by Dr. Frederic Jericho in [place] and patient has now relocated to [facility].     Interval history-patient reports a dull aching pain over her lower part of the right rib cage whenever she stretches her hand.  No known injuries or sprains.  Denies any breast concerns  ECOG PS- 0 Pain scale- 0    Review of systems- Review of Systems  Constitutional:  Negative for chills, fever, malaise/fatigue and weight loss.  HENT:  Negative for congestion, ear  discharge and nosebleeds.   Eyes:  Negative for blurred vision.  Respiratory:  Negative for cough, hemoptysis, sputum production, shortness of breath and wheezing.   Cardiovascular:  Negative for chest pain, palpitations, orthopnea and claudication.  Gastrointestinal:  Negative for abdominal pain, blood in stool, constipation, diarrhea, heartburn, melena, nausea and vomiting.  Genitourinary:  Negative for dysuria, flank pain, frequency, hematuria and urgency.  Musculoskeletal:  Negative for back pain, joint pain and myalgias.  Skin:  Negative for rash.  Neurological:  Negative for dizziness, tingling, focal weakness, seizures, weakness and headaches.  Endo/Heme/Allergies:  Does not bruise/bleed easily.  Psychiatric/Behavioral:  Negative for depression and suicidal ideas. The patient does not have insomnia.       No Known Allergies   Past Medical History:  Diagnosis Date   Breast cancer ([place])    Cancer ([place]) 2019   Right breast   Personal history of chemotherapy 2019   Rt breast   Personal history of radiation therapy 2019   Rt breast   Thyroid disease      Past Surgical History:  Procedure Laterality Date   BREAST BIOPSY Left    stereo-neg   BREAST LUMPECTOMY Right 2019   invasive ductal carcinoma    Social History   Socioeconomic History   Marital status: Married    Spouse name: Cindie Laroche   Number of children: 2   Years of education: Not on file   Highest education level: Not on file  Occupational History   Not on file  Tobacco Use   Smoking status: Former  Packs/day: 1.00    Years: 26.00    Total pack years: 26.00    Types: Cigarettes    Quit date: 2000    Years since quitting: 23.8   Smokeless tobacco: Never  Vaping Use   Vaping Use: Never used  Substance and Sexual Activity   Alcohol use: Yes    Comment: once every 2 weeks   Drug use: Never   Sexual activity: Not Currently    Partners: Male  Other Topics Concern   Not on file  Social History  Narrative   Not on file   Social Determinants of Health   Financial Resource Strain: Not on file  Food Insecurity: Not on file  Transportation Needs: Not on file  Physical Activity: Not on file  Stress: Not on file  Social Connections: Not on file  Intimate Partner Violence: Not on file    Family History  Problem Relation Age of Onset   Breast cancer Mother 52   Breast cancer Maternal Aunt    Heart attack Father      Current Outpatient Medications:    Calcium Carbonate (CALCIUM 500 PO), Take 1,200 mg by mouth daily., Disp: , Rfl:    levothyroxine (SYNTHROID) 100 MCG tablet, Take 100 mcg by mouth daily before breakfast. , Disp: , Rfl:    Multiple Vitamin (MULTI-VITAMIN DAILY PO), Take 1 tablet by mouth daily., Disp: , Rfl:    anastrozole (ARIMIDEX) 1 MG tablet, Take 1 tablet (1 mg total) by mouth daily., Disp: 90 tablet, Rfl: 3   omeprazole (PRILOSEC) 20 MG capsule, Take 1 capsule (20 mg total) by mouth daily., Disp: 90 capsule, Rfl: 3  Physical exam:  Vitals:   06/08/22 1302  BP: 119/81  Pulse: 68  Resp: 16  Temp: (!) 96.7 F (35.9 C)  SpO2: 100%  Weight: 183 lb 8 oz (83.2 kg)   Physical Exam Constitutional:      General: She is not in acute distress. Cardiovascular:     Rate and Rhythm: Normal rate and regular rhythm.     Heart sounds: Normal heart sounds.  Pulmonary:     Effort: Pulmonary effort is normal.     Breath sounds: Normal breath sounds.  Abdominal:     General: Bowel sounds are normal.     Palpations: Abdomen is soft.  Skin:    General: Skin is warm and dry.  Neurological:     Mental Status: She is alert and oriented to person, place, and time.    Breast exam was performed in seated and lying down position. Patient is status post right lumpectomy with a well-healed surgical scar. No evidence of any palpable masses. No evidence of axillary adenopathy. No evidence of any palpable masses or lumps in the left breast. No evidence of leftt axillary  adenopathy      Latest Ref Rng & Units 10/08/2021    9:24 AM  CMP  Glucose 70 - 99 mg/dL 87   BUN 8 - 27 mg/dL 10   Creatinine 0.57 - 1.00 mg/dL 0.74   Sodium 134 - 144 mmol/L 140   Potassium 3.5 - 5.2 mmol/L 4.6   Chloride 96 - 106 mmol/L 102   CO2 20 - 29 mmol/L 26   Calcium 8.7 - 10.3 mg/dL 9.5   Total Protein 6.0 - 8.5 g/dL 7.5   Total Bilirubin 0.0 - 1.2 mg/dL 0.7   Alkaline Phos 44 - 121 IU/L 74   AST 0 - 40 IU/L 21   ALT 0 -  32 IU/L 14       Latest Ref Rng & Units 10/08/2021    9:24 AM  CBC  WBC 3.4 - 10.8 x10E3/uL 4.5   Hemoglobin 11.1 - 15.9 g/dL 14.1   Hematocrit 34.0 - 46.6 % 42.3   Platelets 150 - 450 x10E3/uL 264     Assessment and plan- Patient is a 66 y.o. female  with history of right breast cancer ER/PR positive HER2 negative in 2019 s/p surgery, adjuvant chemotherapy and radiation treatment.  This is a routine follow-up visit on Arimidex  Clinically patient is doing well with no concerning signs and symptoms of recurrence based on today's exam.  Patient will continue taking Arimidex along with calcium and vitamin D.  I will see her back in 6 months with a mammogram and a bone density scan prior.  Patient's baseline bone density scan in March 2022 showed osteopenia but her 10-year probability of major osteoporotic fracture was less than 20% and hip fracture was less than 3%.  She is therefore not taking any bisphosphonates at this time.  Suspect pain over her right rib cage is likely cysts musculoskeletal but it has been ongoing for 3 weeks.  If pain worsens she will need to get in touch with her primary care doctor for consideration for further imaging   Visit Diagnosis 1. Encounter for follow-up surveillance of breast cancer   2. Visit for monitoring Arimidex therapy   3. Osteopenia of hip, unspecified laterality      Dr. Randa Evens, MD, MPH [facility] [phone] 06/08/2022 3:30 PM

## 2022-06-08 NOTE — Progress Notes (Signed)
Pt has noticed more fatigue recently; not sure if anything is occuring or if the time change. Pt states she has noticed on her rt side when lifting her arms tends to feel a sore spot. Pt states it makes her nevous due to the cancer being on that side. As well as, daughter will like to discuss BRCA testing. Pt will like to have a 90 day prescription for omeprazole and anastrzole.

## 2022-06-17 ENCOUNTER — Ambulatory Visit: Payer: 59 | Admitting: Oncology

## 2022-07-01 ENCOUNTER — Encounter: Payer: Self-pay | Admitting: Licensed Clinical Social Worker

## 2022-07-01 ENCOUNTER — Inpatient Hospital Stay: Payer: Medicare Other | Attending: Oncology | Admitting: Licensed Clinical Social Worker

## 2022-07-01 ENCOUNTER — Inpatient Hospital Stay: Payer: Medicare Other

## 2022-07-01 DIAGNOSIS — Z8042 Family history of malignant neoplasm of prostate: Secondary | ICD-10-CM

## 2022-07-01 DIAGNOSIS — Z79811 Long term (current) use of aromatase inhibitors: Secondary | ICD-10-CM

## 2022-07-01 DIAGNOSIS — C50911 Malignant neoplasm of unspecified site of right female breast: Secondary | ICD-10-CM

## 2022-07-01 DIAGNOSIS — Z803 Family history of malignant neoplasm of breast: Secondary | ICD-10-CM | POA: Diagnosis not present

## 2022-07-01 DIAGNOSIS — Z853 Personal history of malignant neoplasm of breast: Secondary | ICD-10-CM | POA: Diagnosis not present

## 2022-07-01 DIAGNOSIS — Z170 Estrogen receptor positive status [ER+]: Secondary | ICD-10-CM

## 2022-07-01 NOTE — Progress Notes (Signed)
REFERRING PROVIDER: Sindy Guadeloupe, MD [place],  [place] [id number]  PRIMARY PROVIDER:  Glean Hess, MD  PRIMARY REASON FOR VISIT:  1. Personal history of breast cancer   2. Family history of breast cancer   3. Family history of prostate cancer      HISTORY OF PRESENT ILLNESS:   Alexa Knight, a 66 y.o. female, was seen for a  cancer genetics consultation at the request of Dr. Janese Banks due to a personal and family history of cancer.  Alexa Knight presents to clinic today to discuss the possibility of a hereditary predisposition to cancer, genetic testing, and to further clarify her future cancer risks, as well as potential cancer risks for family members.   CANCER HISTORY:  In 2019, at the age of 37, Alexa Knight was diagnosed with invasive ductal carcinoma of the right breast, ER/PR+, HER2-. The treatment plan included lumpectomy, adjuvant chemotherapy, adjuvant radiation and Arimidex.   RISK FACTORS:  Menarche was at age 8-14.  First live birth at age 82.  OCP use for approximately 0 years.  Ovaries intact: yes.  Hysterectomy: no.  Menopausal status: postmenopausal.  HRT use: 0 years. Colonoscopy: yes; normal.  Past Medical History:  Diagnosis Date   Breast cancer ([place])    Cancer ([place]) 2019   Right breast   Personal history of chemotherapy 2019   Rt breast   Personal history of radiation therapy 2019   Rt breast   Thyroid disease     Past Surgical History:  Procedure Laterality Date   BREAST BIOPSY Left    stereo-neg   BREAST LUMPECTOMY Right 2019   invasive ductal carcinoma    FAMILY HISTORY:  We obtained a detailed, 4-generation family history.  Significant diagnoses are listed below: Family History  Problem Relation Age of Onset   Breast cancer Mother 6       d. recurrence 52   Heart attack Father    Prostate cancer Brother    Prostate cancer Brother    Breast cancer Maternal Aunt        dx 52s   Cancer Paternal Aunt         unk type   Alexa Knight has 1 son, 88 and 1 daughter, 48. She has 2 brothers and 1 sister. Both brothers have had prostate cancer in their 13s.   Alexa Knight mother had breast cancer in her early 105s and then a recurrence in her 55s, she passed at 68. Patient had 1 maternal aunt who also had breast cancer, diagnosed in her 73s.   Alexa Knight father passed at 54. Patient had 1 paternal aunt who had cancer, unknown type. No other known cancers on this side of the family.  Alexa Knight is unaware of previous family history of genetic testing for hereditary cancer risks. There is no reported Ashkenazi Jewish ancestry. There is no known consanguinity.    GENETIC COUNSELING ASSESSMENT: Alexa Knight is a 66 y.o. female with a personal and family history of breast and prostate cancer which is somewhat suggestive of a hereditary cancer syndrome and predisposition to cancer. We, therefore, discussed and recommended the following at today's visit.   DISCUSSION: We discussed that approximately 10% of breast cancer is hereditary. Most cases of hereditary breast cancer are associated with BRCA1/BRCA2 genes, although there are other genes associated with hereditary cancer as well. Cancers and risks are gene specific. We discussed that testing is beneficial for several reasons including knowing about cancer risks,  identifying potential screening and risk-reduction options that may be appropriate, and to understand if other family members could be at risk for cancer and allow them to undergo genetic testing.   We reviewed the characteristics, features and inheritance patterns of hereditary cancer syndromes. We also discussed genetic testing, including the appropriate family members to test, the process of testing, insurance coverage and turn-around-time for results. We discussed the implications of a negative, positive and/or variant of uncertain significant result. We recommended Alexa Knight pursue genetic testing  for the [facility] CancerNext-Expanded+RNA gene panel.   Based on Alexa Knight personal and family history of cancer, she meets medical criteria for genetic testing. Despite that she meets criteria, she may still have an out of pocket cost. We discussed that if her out of pocket cost for testing is over $100, the laboratory will call and confirm whether she wants to proceed with testing.  If the out of pocket cost of testing is less than $100 she will be billed by the genetic testing laboratory.   PLAN: After considering the risks, benefits, and limitations, Alexa Knight provided informed consent to pursue genetic testing and the blood sample was sent to [facility] for analysis of the CancerNext-Expanded+RNA panel. Results should be available within approximately 2-3 weeks' time, at which point they will be disclosed by telephone to Alexa Knight, as will any additional recommendations warranted by these results. Alexa Knight will receive a summary of her genetic counseling visit and a copy of her results once available. This information will also be available in Epic.   Alexa Knight's questions were answered to her satisfaction today. Our contact information was provided should additional questions or concerns arise. Thank you for the referral and allowing [place] to share in the care of your patient.   Faith Rogue, MS, [facility] Genetic Counselor [email or web address] Phone: [phone]  The patient was seen for a total of 25 minutes in face-to-face genetic counseling.  Dr. Grayland Ormond was available for discussion regarding this case.   _______________________________________________________________________ For Office Staff:  Number of people involved in session: 1 Was an Intern/ student involved with case: no

## 2022-07-06 DIAGNOSIS — E063 Autoimmune thyroiditis: Secondary | ICD-10-CM | POA: Diagnosis not present

## 2022-07-06 DIAGNOSIS — E038 Other specified hypothyroidism: Secondary | ICD-10-CM | POA: Diagnosis not present

## 2022-07-13 DIAGNOSIS — E038 Other specified hypothyroidism: Secondary | ICD-10-CM | POA: Diagnosis not present

## 2022-07-13 DIAGNOSIS — E042 Nontoxic multinodular goiter: Secondary | ICD-10-CM | POA: Diagnosis not present

## 2022-07-13 DIAGNOSIS — E063 Autoimmune thyroiditis: Secondary | ICD-10-CM | POA: Diagnosis not present

## 2022-07-17 ENCOUNTER — Telehealth: Payer: Self-pay | Admitting: Licensed Clinical Social Worker

## 2022-07-17 ENCOUNTER — Encounter: Payer: Self-pay | Admitting: Licensed Clinical Social Worker

## 2022-07-17 ENCOUNTER — Ambulatory Visit: Payer: Self-pay | Admitting: Licensed Clinical Social Worker

## 2022-07-17 DIAGNOSIS — Z1379 Encounter for other screening for genetic and chromosomal anomalies: Secondary | ICD-10-CM | POA: Insufficient documentation

## 2022-07-17 NOTE — Progress Notes (Signed)
HPI:   Ms. Iseman was previously seen in the [facility] due to a personal and family history of cancer and concerns regarding a hereditary predisposition to cancer. Please refer to our prior cancer genetics clinic note for more information regarding our discussion, assessment and recommendations, at the time. Ms. Gondek recent genetic test results were disclosed to her, as were recommendations warranted by these results. These results and recommendations are discussed in more detail below.  CANCER HISTORY:  Oncology History   No history exists.    FAMILY HISTORY:  We obtained a detailed, 4-generation family history.  Significant diagnoses are listed below: Family History  Problem Relation Age of Onset   Breast cancer Mother 50       d. recurrence 52   Heart attack Father    Prostate cancer Brother    Prostate cancer Brother    Breast cancer Maternal Aunt        dx 11s   Cancer Paternal Aunt        unk type    Ms. Kimbrell has 1 son, 34 and 1 daughter, 81. She has 2 brothers and 1 sister. Both brothers have had prostate cancer in their 46s.    Ms. Penafiel mother had breast cancer in her early 31s and then a recurrence in her 69s, she passed at 49. Patient had 1 maternal aunt who also had breast cancer, diagnosed in her 31s.    Ms. Folmar's father passed at 58. Patient had 1 paternal aunt who had cancer, unknown type. No other known cancers on this side of the family.   Ms. Needles is unaware of previous family history of genetic testing for hereditary cancer risks. There is no reported Ashkenazi Jewish ancestry. There is no known consanguinity.     GENETIC TEST RESULTS:  The [facility] CancerNext-Expanded+RNA Panel found no pathogenic mutations.   The CancerNext-Expanded + RNAinsight gene panel offered by [employer] and includes sequencing and rearrangement analysis for the following 77 genes: IP, ALK, APC*, ATM*, AXIN2, BAP1, BARD1, BLM, BMPR1A,  BRCA1*, BRCA2*, BRIP1*, CDC73, CDH1*,CDK4, CDKN1B, CDKN2A, CHEK2*, CTNNA1, DICER1, FANCC, FH, FLCN, GALNT12, KIF1B, LZTR1, MAX, MEN1, MET, MLH1*, MSH2*, MSH3, MSH6*, MUTYH*, NBN, NF1*, NF2, NTHL1, PALB2*, PHOX2B, PMS2*, POT1, PRKAR1A, PTCH1, PTEN*, RAD51C*, RAD51D*,RB1, RECQL, RET, SDHA, SDHAF2, SDHB, SDHC, SDHD, SMAD4, SMARCA4, SMARCB1, SMARCE1, STK11, SUFU, TMEM127, TP53*,TSC1, TSC2, VHL and XRCC2 (sequencing and deletion/duplication); EGFR, EGLN1, HOXB13, KIT, MITF, PDGFRA, POLD1 and POLE (sequencing only); EPCAM and GREM1 (deletion/duplication only).   The test report has been scanned into EPIC and is located under the Molecular Pathology section of the Results Review tab.  A portion of the result report is included below for reference. Genetic testing reported out on 07/16/2022.      Even though a pathogenic variant was not identified, possible explanations for the cancer in the family may include: There may be no hereditary risk for cancer in the family. The cancers in Ms. Milles and/or her family may be sporadic/familial or due to other genetic and environmental factors. There may be a gene mutation in one of these genes that current testing methods cannot detect but that chance is small. There could be another gene that has not yet been discovered, or that we have not yet tested, that is responsible for the cancer diagnoses in the family.  It is also possible there is a hereditary cause for the cancer in the family that Ms. Rampersaud did not inherit.  Therefore, it is important  to remain in touch with cancer genetics in the future so that we can continue to offer Ms. Garno the most up to date genetic testing.   ADDITIONAL GENETIC TESTING:  We discussed with Ms. Sestak that her genetic testing was fairly extensive.  If there are additional relevant genes identified to increase cancer risk that can be analyzed in the future, we would be happy to discuss and coordinate this testing at that  time.    CANCER SCREENING RECOMMENDATIONS:  Ms. Stanczyk's test result is considered negative (normal).  This means that we have not identified a hereditary cause for her personal and family history of cancer at this time.   An individual's cancer risk and medical management are not determined by genetic test results alone. Overall cancer risk assessment incorporates additional factors, including personal medical history, family history, and any available genetic information that may result in a personalized plan for cancer prevention and surveillance. Therefore, it is recommended she continue to follow the cancer management and screening guidelines provided by her oncology and primary healthcare provider.  RECOMMENDATIONS FOR FAMILY MEMBERS:   Since she did not inherit a identifiable mutation in a cancer predisposition gene included on this panel, her children could not have inherited a known mutation from her in one of these genes. Individuals in this family might be at some increased risk of developing cancer, over the general population risk, due to the family history of cancer.  Individuals in the family should notify their providers of the family history of cancer. We recommend women in this family have a yearly mammogram beginning at age 8, or 34 years younger than the earliest onset of cancer, an annual clinical breast exam, and perform monthly breast self-exams.  Family members should have colonoscopies by at age 29, or earlier, as recommended by their providers. Other members of the family may still carry a pathogenic variant in one of these genes that Ms. Debo did not inherit. Based on the family history, we recommend her maternal relatives, especially those who have had cancer, have genetic counseling and testing. Ms. Fei will let us know if we can be of any assistance in coordinating genetic counseling and/or testing for this family member.    FOLLOW-UP:  Lastly, we discussed with  Ms. Ozimek that cancer genetics is a rapidly advancing field and it is possible that new genetic tests will be appropriate for her and/or her family members in the future. We encouraged her to remain in contact with cancer genetics on an annual basis so we can update her personal and family histories and let her know of advances in cancer genetics that may benefit this family.   Our contact number was provided. Ms. Garro's questions were answered to her satisfaction, and she knows she is welcome to call us at anytime with additional questions or concerns.    Faith Rogue, MS, [facility] Genetic Counselor [email or web address] Phone: [phone]

## 2022-07-17 NOTE — Telephone Encounter (Signed)
I contacted Ms. Ahmad to discuss her genetic testing results. No pathogenic variants were identified in the 77 genes analyzed. Detailed clinic note to follow.   The test report has been scanned into EPIC and is located under the Molecular Pathology section of the Results Review tab.  A portion of the result report is included below for reference.      Faith Rogue, MS, [facility] Genetic Counselor [email or web address] Phone: [phone]

## 2022-07-28 ENCOUNTER — Encounter: Payer: Self-pay | Admitting: Licensed Clinical Social Worker

## 2022-08-03 DIAGNOSIS — K080 Exfoliation of teeth due to systemic causes: Secondary | ICD-10-CM | POA: Diagnosis not present

## 2022-08-19 DIAGNOSIS — E042 Nontoxic multinodular goiter: Secondary | ICD-10-CM | POA: Diagnosis not present

## 2022-09-30 DIAGNOSIS — K080 Exfoliation of teeth due to systemic causes: Secondary | ICD-10-CM | POA: Diagnosis not present

## 2022-10-13 ENCOUNTER — Encounter: Payer: 59 | Admitting: Internal Medicine

## 2022-10-14 ENCOUNTER — Ambulatory Visit (INDEPENDENT_AMBULATORY_CARE_PROVIDER_SITE_OTHER): Payer: Medicare Other | Admitting: Internal Medicine

## 2022-10-14 ENCOUNTER — Encounter: Payer: Self-pay | Admitting: Internal Medicine

## 2022-10-14 VITALS — BP 110/70 | HR 68 | Ht 65.5 in | Wt 186.0 lb

## 2022-10-14 DIAGNOSIS — Z23 Encounter for immunization: Secondary | ICD-10-CM

## 2022-10-14 DIAGNOSIS — C50411 Malignant neoplasm of upper-outer quadrant of right female breast: Secondary | ICD-10-CM

## 2022-10-14 DIAGNOSIS — Z1211 Encounter for screening for malignant neoplasm of colon: Secondary | ICD-10-CM | POA: Diagnosis not present

## 2022-10-14 DIAGNOSIS — E782 Mixed hyperlipidemia: Secondary | ICD-10-CM | POA: Diagnosis not present

## 2022-10-14 DIAGNOSIS — Z0000 Encounter for general adult medical examination without abnormal findings: Secondary | ICD-10-CM

## 2022-10-14 DIAGNOSIS — D485 Neoplasm of uncertain behavior of skin: Secondary | ICD-10-CM | POA: Diagnosis not present

## 2022-10-14 DIAGNOSIS — E042 Nontoxic multinodular goiter: Secondary | ICD-10-CM | POA: Diagnosis not present

## 2022-10-14 DIAGNOSIS — Z5181 Encounter for therapeutic drug level monitoring: Secondary | ICD-10-CM | POA: Diagnosis not present

## 2022-10-14 DIAGNOSIS — Z79811 Long term (current) use of aromatase inhibitors: Secondary | ICD-10-CM

## 2022-10-14 DIAGNOSIS — Z170 Estrogen receptor positive status [ER+]: Secondary | ICD-10-CM

## 2022-10-14 NOTE — Patient Instructions (Signed)
Referral placed to [facility] - you can call them next week to make an appointment.

## 2022-10-14 NOTE — Progress Notes (Signed)
Date:  10/14/2022   Name:  Alexa Knight   DOB:  Dec 10, 1955   MRN:  [id number]   Chief Complaint: Annual Exam Alexa Knight is a 67 y.o. female who presents today for her Complete Annual Exam. She feels well. She reports exercising - biking and walking and gardening. She reports she is sleeping well. Breast complaints - none.  Mammogram: 09/2021 - scheduled 4/1 DEXA: 09/2020 - scheduled 4/1 Colonoscopy: FIT 10/2021  Health Maintenance Due  Topic Date Due   Medicare Annual Wellness (AWV)  Never done   HIV Screening  Never done   DTaP/Tdap/Td (1 - Tdap) Never done   COLONOSCOPY (Pts 45-30yrs Insurance coverage will need to be confirmed)  Never done   COVID-19 Vaccine (3 - Pfizer risk series) 01/05/2020    Immunization History  Administered Date(s) Administered   Influenza,inj,Quad PF,6+ Mos 05/30/2018, 06/12/2020   PFIZER(Purple Top)SARS-COV-2 Vaccination 11/17/2019, 12/08/2019   PNEUMOCOCCAL CONJUGATE-20 10/14/2022   Zoster Recombinat (Shingrix) 10/08/2021, 01/28/2022    Thyroid Problem Presents for follow-up (followed by endo) visit. Patient reports no anxiety, constipation, diarrhea, fatigue, palpitations or tremors. The symptoms have been stable. Her past medical history is significant for hyperlipidemia.  Rash This is a chronic problem. The problem is unchanged. The affected locations include the face (nose). The rash is characterized by redness (and bleeding on one occasion). Pertinent negatives include no congestion, cough, diarrhea, fatigue, fever, shortness of breath or vomiting. Past treatments include nothing.  Hyperlipidemia This is a chronic problem. The problem is uncontrolled. Recent lipid tests were reviewed and are high. Pertinent negatives include no chest pain or shortness of breath.    Lab Results  Component Value Date   NA 140 10/08/2021   K 4.6 10/08/2021   CO2 26 10/08/2021   GLUCOSE 87 10/08/2021   BUN 10 10/08/2021   CREATININE 0.74 10/08/2021    CALCIUM 9.5 10/08/2021   EGFR 90 10/08/2021   GFRNONAA 84 06/12/2020   Lab Results  Component Value Date   CHOL 179 10/08/2021   HDL 40 10/08/2021   LDLCALC 117 (H) 10/08/2021   TRIG 119 10/08/2021   CHOLHDL 4.5 (H) 10/08/2021   Lab Results  Component Value Date   TSH 2.890 10/08/2021   No results found for: "HGBA1C" Lab Results  Component Value Date   WBC 4.5 10/08/2021   HGB 14.1 10/08/2021   HCT 42.3 10/08/2021   MCV 90 10/08/2021   PLT 264 10/08/2021   Lab Results  Component Value Date   ALT 14 10/08/2021   AST 21 10/08/2021   ALKPHOS 74 10/08/2021   BILITOT 0.7 10/08/2021   No results found for: "25OHVITD2", "25OHVITD3", "VD25OH"   Review of Systems  Constitutional:  Negative for chills, fatigue and fever.  HENT:  Positive for nosebleeds (from lesion on left nares). Negative for congestion, hearing loss, tinnitus, trouble swallowing and voice change.   Eyes:  Negative for visual disturbance.  Respiratory:  Negative for cough, chest tightness, shortness of breath and wheezing.   Cardiovascular:  Negative for chest pain, palpitations and leg swelling.  Gastrointestinal:  Negative for abdominal pain, constipation, diarrhea and vomiting.  Endocrine: Negative for polydipsia and polyuria.  Genitourinary:  Negative for dysuria, frequency, genital sores, vaginal bleeding and vaginal discharge.  Musculoskeletal:  Negative for arthralgias, gait problem and joint swelling.  Skin:  Positive for rash. Negative for color change.  Neurological:  Negative for dizziness, tremors, light-headedness and headaches.  Hematological:  Negative for adenopathy. Does not bruise/bleed  easily.  Psychiatric/Behavioral:  Negative for dysphoric mood and sleep disturbance. The patient is not nervous/anxious.     Patient Active Problem List   Diagnosis Date Noted   Genetic testing 07/17/2022   ANA positive 10/30/2020   Rheumatoid factor positive 10/30/2020   Mixed hyperlipidemia 10/05/2020    Raynaud's syndrome 10/04/2020   Multinodular goiter 06/12/2020   Pharyngeal dysphagia 06/12/2020   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 in female, estrogen receptor positive ([place]) 07/28/2018    No Known Allergies  Past Surgical History:  Procedure Laterality Date   BREAST BIOPSY Left    stereo-neg   BREAST LUMPECTOMY Right 2019   invasive ductal carcinoma    Social History   Tobacco Use   Smoking status: Former    Packs/day: 1.00    Years: 26.00    Additional pack years: 0.00    Total pack years: 26.00    Types: Cigarettes    Quit date: 2000    Years since quitting: 24.2   Smokeless tobacco: Never  Vaping Use   Vaping Use: Never used  Substance Use Topics   Alcohol use: Yes    Comment: once every 2 weeks   Drug use: Never     Medication list has been reviewed and updated.  Current Meds  Medication Sig   anastrozole (ARIMIDEX) 1 MG tablet Take 1 tablet (1 mg total) by mouth daily.   Calcium Carbonate (CALCIUM 500 PO) Take 1,200 mg by mouth daily.   levothyroxine (SYNTHROID) 100 MCG tablet Take 100 mcg by mouth daily before breakfast.    Multiple Vitamin (MULTI-VITAMIN DAILY PO) Take 1 tablet by mouth daily.   omeprazole (PRILOSEC) 20 MG capsule Take 1 capsule (20 mg total) by mouth daily.       10/14/2022    8:40 AM 10/08/2021    8:42 AM 03/07/2021    7:57 AM 10/04/2020    8:35 AM  GAD 7 : Generalized Anxiety Score  Nervous, Anxious, on Edge 0 0 0 0  Control/stop worrying 0 0 0 0  Worry too much - different things 0 0 0 0  Trouble relaxing 0 0 0 0  Restless 0 0 0 0  Easily annoyed or irritable 0 0 0 0  Afraid - awful might happen 0 0 0 0  Total GAD 7 Score 0 0 0 0  Anxiety Difficulty Not difficult at all Not difficult at all Not difficult at all Not difficult at all       10/14/2022    8:40 AM 10/08/2021    8:42 AM 03/07/2021    7:56 AM  Depression screen PHQ 2/9  Decreased Interest 0 0 0  Down, Depressed, Hopeless 0 0 0  PHQ - 2 Score  0 0 0  Altered sleeping 0 0 0  Tired, decreased energy 0 0 0  Change in appetite 0 0 0  Feeling bad or failure about yourself  0 0 0  Trouble concentrating 0 0 0  Moving slowly or fidgety/restless 0 0 0  Suicidal thoughts 0 0 0  PHQ-9 Score 0 0 0  Difficult doing work/chores Not difficult at all Not difficult at all Not difficult at all    BP Readings from Last 3 Encounters:  10/14/22 110/70  06/08/22 119/81  12/15/21 106/80    Physical Exam Vitals and nursing note reviewed.  Constitutional:      General: She is not in acute distress.    Appearance: She is well-developed.  HENT:  Head: Normocephalic and atraumatic.     Comments: ?capillary hemangioma left nares    Right Ear: Tympanic membrane and ear canal normal.     Left Ear: Tympanic membrane and ear canal normal.     Nose:     Right Sinus: No maxillary sinus tenderness.     Left Sinus: No maxillary sinus tenderness.  Eyes:     General: No scleral icterus.       Right eye: No discharge.        Left eye: No discharge.     Conjunctiva/sclera: Conjunctivae normal.  Neck:     Thyroid: No thyromegaly.     Vascular: No carotid bruit.  Cardiovascular:     Rate and Rhythm: Normal rate and regular rhythm.     Pulses: Normal pulses.     Heart sounds: Normal heart sounds.  Pulmonary:     Effort: Pulmonary effort is normal. No respiratory distress.     Breath sounds: No wheezing.  Chest:  Breasts:    Right: Skin change (lumpectomy scar outer upper breast) present. No mass, nipple discharge or tenderness.     Left: No mass, nipple discharge, skin change or tenderness.  Abdominal:     General: Bowel sounds are normal.     Palpations: Abdomen is soft.     Tenderness: There is no abdominal tenderness.  Musculoskeletal:        General: Normal range of motion.     Cervical back: Normal range of motion. No erythema.     Right lower leg: No edema.     Left lower leg: No edema.  Lymphadenopathy:     Cervical: No cervical  adenopathy.  Skin:    General: Skin is warm and dry.     Capillary Refill: Capillary refill takes less than 2 seconds.     Findings: No rash.  Neurological:     Mental Status: She is alert and oriented to person, place, and time.     Cranial Nerves: No cranial nerve deficit.     Sensory: No sensory deficit.     Deep Tendon Reflexes: Reflexes are normal and symmetric.  Psychiatric:        Attention and Perception: Attention normal.        Mood and Affect: Mood normal.     Wt Readings from Last 3 Encounters:  10/14/22 186 lb (84.4 kg)  06/08/22 183 lb 8 oz (83.2 kg)  12/15/21 176 lb 12.8 oz (80.2 kg)    BP 110/70   Pulse 68   Ht 5' 5.5" (1.664 m)   Wt 186 lb (84.4 kg)   SpO2 97%   BMI 30.48 kg/m   Assessment and Plan:  Problem List Items Addressed This Visit       Endocrine   Multinodular goiter    Followed by Endocrinology On synthroid, last TSH 3.08        Other   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 in female, estrogen receptor positive ([place])    Annual mammogram due - followed by Oncology Will complete 5 years of AI in July 2025      Mixed hyperlipidemia    Lipids controlled on diet alone      Relevant Orders   Lipid panel   Other Visit Diagnoses     Annual physical exam    -  Primary   continue healthy diet and exercise immunizations are up to date   Colon cancer screening       pt  declines colonoscopy FIt ordered   Relevant Orders   Fecal occult blood, imunochemical   Encounter for monitoring aromatase inhibitor therapy       Relevant Orders   CBC with Differential/Platelet   Comprehensive metabolic panel   Need for vaccination for pneumococcus       Relevant Orders   Pneumococcal conjugate vaccine 20-valent (Completed)   Neoplasm of uncertain behavior of skin       Relevant Orders   Ambulatory referral to Dermatology       Return in about 1 year (around 10/14/2023) for CPX.   Partially dictated using [place], any  errors are not intentional.  Glean Hess, MD [place], [place]

## 2022-10-14 NOTE — Assessment & Plan Note (Addendum)
Followed by Endocrinology On synthroid, last TSH 3.08

## 2022-10-14 NOTE — Assessment & Plan Note (Signed)
Lipids controlled on diet alone

## 2022-10-14 NOTE — Assessment & Plan Note (Signed)
Annual mammogram due - followed by Oncology Will complete 5 years of AI in July 2025

## 2022-10-15 DIAGNOSIS — Z1211 Encounter for screening for malignant neoplasm of colon: Secondary | ICD-10-CM | POA: Diagnosis not present

## 2022-10-15 LAB — COMPREHENSIVE METABOLIC PANEL
ALT: 18 IU/L (ref 0–32)
AST: 22 IU/L (ref 0–40)
Albumin/Globulin Ratio: 1.2 (ref 1.2–2.2)
Albumin: 3.9 g/dL (ref 3.9–4.9)
Alkaline Phosphatase: 91 IU/L (ref 44–121)
BUN/Creatinine Ratio: 19 (ref 12–28)
BUN: 12 mg/dL (ref 8–27)
Bilirubin Total: 0.7 mg/dL (ref 0.0–1.2)
CO2: 23 mmol/L (ref 20–29)
Calcium: 9.2 mg/dL (ref 8.7–10.3)
Chloride: 103 mmol/L (ref 96–106)
Creatinine, Ser: 0.63 mg/dL (ref 0.57–1.00)
Globulin, Total: 3.3 g/dL (ref 1.5–4.5)
Glucose: 85 mg/dL (ref 70–99)
Potassium: 4.6 mmol/L (ref 3.5–5.2)
Sodium: 140 mmol/L (ref 134–144)
Total Protein: 7.2 g/dL (ref 6.0–8.5)
eGFR: 98 mL/min/{1.73_m2} (ref >59)

## 2022-10-15 LAB — CBC WITH DIFFERENTIAL/PLATELET
Basophils Absolute: 0.1 10*3/uL (ref 0.0–0.2)
Basos: 1 %
EOS (ABSOLUTE): 0.3 10*3/uL (ref 0.0–0.4)
Eos: 7 %
Hematocrit: 39.9 % (ref 34.0–46.6)
Hemoglobin: 13.1 g/dL (ref 11.1–15.9)
Immature Grans (Abs): 0 10*3/uL (ref 0.0–0.1)
Immature Granulocytes: 0 %
Lymphocytes Absolute: 1.5 10*3/uL (ref 0.7–3.1)
Lymphs: 31 %
MCH: 29.6 pg (ref 26.6–33.0)
MCHC: 32.8 g/dL (ref 31.5–35.7)
MCV: 90 fL (ref 79–97)
Monocytes Absolute: 0.6 10*3/uL (ref 0.1–0.9)
Monocytes: 13 %
Neutrophils Absolute: 2.3 10*3/uL (ref 1.4–7.0)
Neutrophils: 48 %
Platelets: 260 10*3/uL (ref 150–450)
RBC: 4.43 x10E6/uL (ref 3.77–5.28)
RDW: 12.9 % (ref 11.7–15.4)
WBC: 4.8 10*3/uL (ref 3.4–10.8)

## 2022-10-15 LAB — LIPID PANEL
Chol/HDL Ratio: 4.3 ratio (ref 0.0–4.4)
Cholesterol, Total: 175 mg/dL (ref 100–199)
HDL: 41 mg/dL (ref >39)
LDL Chol Calc (NIH): 109 mg/dL — ABNORMAL HIGH (ref 0–99)
Triglycerides: 139 mg/dL (ref 0–149)
VLDL Cholesterol Cal: 25 mg/dL (ref 5–40)

## 2022-10-21 LAB — FECAL OCCULT BLOOD, IMMUNOCHEMICAL: Fecal Occult Bld: NEGATIVE

## 2022-10-26 ENCOUNTER — Ambulatory Visit
Admission: RE | Admit: 2022-10-26 | Discharge: 2022-10-26 | Disposition: A | Discharge status: Home / Self Care | Payer: Medicare Other | Source: Ambulatory Visit | Attending: Oncology | Admitting: Oncology

## 2022-10-26 DIAGNOSIS — Z780 Asymptomatic menopausal state: Secondary | ICD-10-CM | POA: Diagnosis not present

## 2022-10-26 DIAGNOSIS — Z1231 Encounter for screening mammogram for malignant neoplasm of breast: Secondary | ICD-10-CM | POA: Diagnosis not present

## 2022-10-26 DIAGNOSIS — Z853 Personal history of malignant neoplasm of breast: Secondary | ICD-10-CM | POA: Diagnosis not present

## 2022-10-26 DIAGNOSIS — Z08 Encounter for follow-up examination after completed treatment for malignant neoplasm: Secondary | ICD-10-CM | POA: Insufficient documentation

## 2022-10-26 DIAGNOSIS — M85852 Other specified disorders of bone density and structure, left thigh: Secondary | ICD-10-CM | POA: Diagnosis not present

## 2022-12-07 ENCOUNTER — Inpatient Hospital Stay: Payer: Medicare Other | Attending: Oncology | Admitting: Oncology

## 2022-12-07 ENCOUNTER — Encounter: Payer: Self-pay | Admitting: Oncology

## 2022-12-07 VITALS — BP 117/74 | HR 60 | Temp 96.4°F | Resp 18 | Ht 65.0 in | Wt 186.0 lb

## 2022-12-07 DIAGNOSIS — Z5181 Encounter for therapeutic drug level monitoring: Secondary | ICD-10-CM

## 2022-12-07 DIAGNOSIS — Z170 Estrogen receptor positive status [ER+]: Secondary | ICD-10-CM | POA: Insufficient documentation

## 2022-12-07 DIAGNOSIS — Z79811 Long term (current) use of aromatase inhibitors: Secondary | ICD-10-CM | POA: Insufficient documentation

## 2022-12-07 DIAGNOSIS — M8588 Other specified disorders of bone density and structure, other site: Secondary | ICD-10-CM | POA: Diagnosis not present

## 2022-12-07 DIAGNOSIS — C50911 Malignant neoplasm of unspecified site of right female breast: Secondary | ICD-10-CM | POA: Diagnosis not present

## 2022-12-07 DIAGNOSIS — M85852 Other specified disorders of bone density and structure, left thigh: Secondary | ICD-10-CM | POA: Diagnosis not present

## 2022-12-07 DIAGNOSIS — Z853 Personal history of malignant neoplasm of breast: Secondary | ICD-10-CM

## 2022-12-07 NOTE — Progress Notes (Signed)
Hematology/Oncology Consult note [facility]  [phone] [phone]  Patient Care Team: Reubin Milan, MD as PCP - General (Internal Medicine) Sherlon Handing, MD as Consulting Physician (Endocrinology) Creig Hines, MD as Consulting Physician (Oncology) Patterson Hammersmith, MD as Consulting Physician (Rheumatology)   Name of the patient: Alexa Knight  [id number]  1956-03-12   Date of visit: 12/07/22  Diagnosis- history of stage I right breast cancer in 2019 currently on Arimidex    Chief complaint/ Reason for visit-routine follow-up of breast cancer  Heme/Onc history: Patient is a 67 year old female who was diagnosed with stage I right breast cancer in 2019.  She had T1CN1A IDC of the right breast that was ER/PR positive and HER-2/neu negative.  Core needle biopsy had showed grade 1 ER 100% positive PR 10% positive and HER-2 negative tumor with a Ki-67 of 5%.  In October 2019 she underwent right lumpectomy with sentinel lymph node dissection which showed a 1.5 cm tumor with positive LVI and 2 out of 2 sentinel lymph nodes that were positive with no evidence of extra nuclear extension.  She received adjuvant dose dense AC chemotherapy and weekly Taxol between December 2019 to May 2020.  She then completed adjuvant radiation therapy and started Arimidex in July 2020.  Patient also has history of Hashimoto's thyroiditis for which she is on levothyroxine.  She was being followed by Dr. Autumn Patty in [place] and patient has now relocated to [facility].       Interval history-patient is tolerating Arimidex well.  She has self-limited hot flashes.  Occasional pain in the lumpectomy site especially when she coughs or sneezes.  ECOG PS- 0 Pain scale- 0   Review of systems- Review of Systems  Constitutional:  Negative for chills, fever, malaise/fatigue and weight loss.  HENT:  Negative for congestion, ear discharge and  nosebleeds.   Eyes:  Negative for blurred vision.  Respiratory:  Negative for cough, hemoptysis, sputum production, shortness of breath and wheezing.   Cardiovascular:  Negative for chest pain, palpitations, orthopnea and claudication.  Gastrointestinal:  Negative for abdominal pain, blood in stool, constipation, diarrhea, heartburn, melena, nausea and vomiting.  Genitourinary:  Negative for dysuria, flank pain, frequency, hematuria and urgency.  Musculoskeletal:  Negative for back pain, joint pain and myalgias.  Skin:  Negative for rash.  Neurological:  Negative for dizziness, tingling, focal weakness, seizures, weakness and headaches.  Endo/Heme/Allergies:  Does not bruise/bleed easily.  Psychiatric/Behavioral:  Negative for depression and suicidal ideas. The patient does not have insomnia.       No Known Allergies   Past Medical History:  Diagnosis Date   Breast cancer (HCC)    Cancer (HCC) 2019   Right breast   Personal history of chemotherapy 2019   Rt breast   Personal history of radiation therapy 2019   Rt breast   Thyroid disease      Past Surgical History:  Procedure Laterality Date   BREAST BIOPSY Left    stereo-neg   BREAST LUMPECTOMY Right 2019   invasive ductal carcinoma    Social History   Socioeconomic History   Marital status: Married    Spouse name: Anne Hahn   Number of children: 2   Years of education: Not on file   Highest education level: Not on file  Occupational History   Not on file  Tobacco Use   Smoking status: Former    Packs/day: 1.00  Years: 26.00    Additional pack years: 0.00    Total pack years: 26.00    Types: Cigarettes    Quit date: 2000    Years since quitting: 24.3   Smokeless tobacco: Never  Vaping Use   Vaping Use: Never used  Substance and Sexual Activity   Alcohol use: Yes    Comment: once every 2 weeks   Drug use: Never   Sexual activity: Not Currently    Partners: Male  Other Topics Concern   Not on  file  Social History Narrative   Not on file   Social Determinants of Health   Financial Resource Strain: Not on file  Food Insecurity: Not on file  Transportation Needs: Not on file  Physical Activity: Not on file  Stress: Not on file  Social Connections: Not on file  Intimate Partner Violence: Not on file    Family History  Problem Relation Age of Onset   Breast cancer Mother 59       d. recurrence 75   Heart attack Father    Prostate cancer Brother    Prostate cancer Brother    Breast cancer Maternal Aunt        dx 71s   Cancer Paternal Aunt        unk type     Current Outpatient Medications:    anastrozole (ARIMIDEX) 1 MG tablet, Take 1 tablet (1 mg total) by mouth daily., Disp: 90 tablet, Rfl: 3   Calcium Carbonate (CALCIUM 500 PO), Take 1,200 mg by mouth daily., Disp: , Rfl:    levothyroxine (SYNTHROID) 100 MCG tablet, Take 100 mcg by mouth daily before breakfast. , Disp: , Rfl:    Multiple Vitamin (MULTI-VITAMIN DAILY PO), Take 1 tablet by mouth daily., Disp: , Rfl:    omeprazole (PRILOSEC) 20 MG capsule, Take 1 capsule (20 mg total) by mouth daily., Disp: 90 capsule, Rfl: 3  Physical exam:  Vitals:   12/07/22 1052  BP: 117/74  Pulse: 60  Resp: 18  Temp: (!) 96.4 F (35.8 C)  TempSrc: Tympanic  SpO2: 100%  Weight: 186 lb (84.4 kg)  Height: 5\' 5"  (1.651 m)   Physical Exam Cardiovascular:     Rate and Rhythm: Normal rate and regular rhythm.     Heart sounds: Normal heart sounds.  Pulmonary:     Effort: Pulmonary effort is normal.     Breath sounds: Normal breath sounds.  Abdominal:     General: Bowel sounds are normal.     Palpations: Abdomen is soft.  Skin:    General: Skin is warm and dry.  Neurological:     Mental Status: She is alert and oriented to person, place, and time.    Breast exam was performed in seated and lying down position. Patient is status post right lumpectomy with a well-healed surgical scar. No evidence of any palpable  masses. No evidence of axillary adenopathy. No evidence of any palpable masses or lumps in the left breast. No evidence of leftt axillary adenopathy      Latest Ref Rng & Units 10/14/2022    9:20 AM  CMP  Glucose 70 - 99 mg/dL 85   BUN 8 - 27 mg/dL 12   Creatinine 0.98 - 1.00 mg/dL 1.19   Sodium 147 - 829 mmol/L 140   Potassium 3.5 - 5.2 mmol/L 4.6   Chloride 96 - 106 mmol/L 103   CO2 20 - 29 mmol/L 23   Calcium 8.7 - 10.3 mg/dL 9.2  Total Protein 6.0 - 8.5 g/dL 7.2   Total Bilirubin 0.0 - 1.2 mg/dL 0.7   Alkaline Phos 44 - 121 IU/L 91   AST 0 - 40 IU/L 22   ALT 0 - 32 IU/L 18       Latest Ref Rng & Units 10/14/2022    9:20 AM  CBC  WBC 3.4 - 10.8 x10E3/uL 4.8   Hemoglobin 11.1 - 15.9 g/dL 09.6   Hematocrit 04.5 - 46.6 % 39.9   Platelets 150 - 450 x10E3/uL 260      Assessment and plan- Patient is a 67 y.o. female with history of right breast cancer ER/PR positive HER2 negative in 2019 s/p surgery, adjuvant chemotherapy and radiation treatment.  She is here for routine follow-up  Patient will continue Arimidex for at least 5 if not 10 years.  She will complete 5 years in July 2025 but given that she received chemotherapy adjuvantly for breast cancer we could consider continuing this for 5 more years.  She has baseline osteopenia which has remained stable over the last couple of years and does not require any adjuvant bisphosphonates.  She will continue with calcium vitamin D and Arimidex.  Clinically she is doing well with no concerning signs and symptoms of recurrence based on today's exam  I will see her back in 6 months no labs   Visit Diagnosis 1. Encounter for follow-up surveillance of breast cancer   2. Visit for monitoring Arimidex therapy   3. Osteopenia of neck of left femur      Dr. Owens Shark, MD, MPH [facility] [phone] 12/07/2022 11:33 AM

## 2022-12-24 IMAGING — MG MM DIGITAL SCREENING BILAT W/ TOMO AND CAD
6 of 10 series · 6 of 30 positions shown · non-contrast
Comparison: Previous exam(s).

CLINICAL DATA: Screening.

EXAM:
DIGITAL SCREENING BILATERAL MAMMOGRAM WITH TOMOSYNTHESIS AND CAD
TECHNIQUE: Bilateral screening digital craniocaudal and mediolateral oblique
mammograms were obtained. Bilateral screening digital breast
tomosynthesis was performed. The images were evaluated with
computer-aided detection.

[R MLO synth-2D]
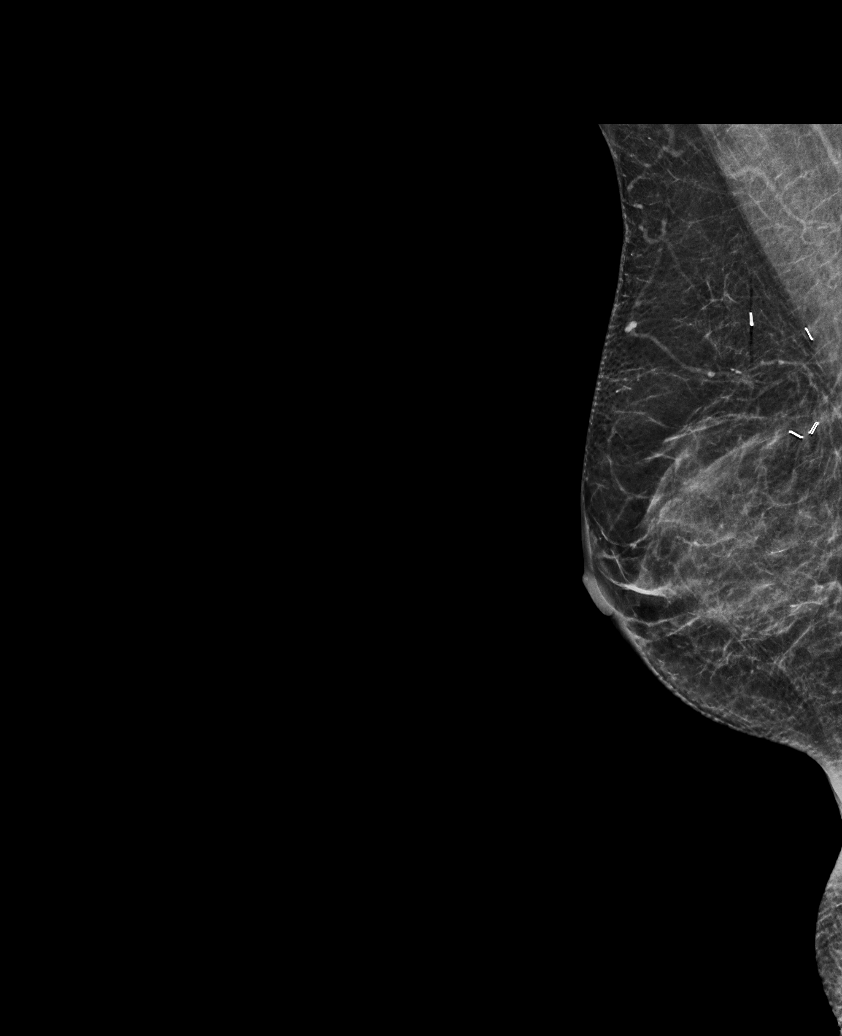

[R XCCL synth-2D]
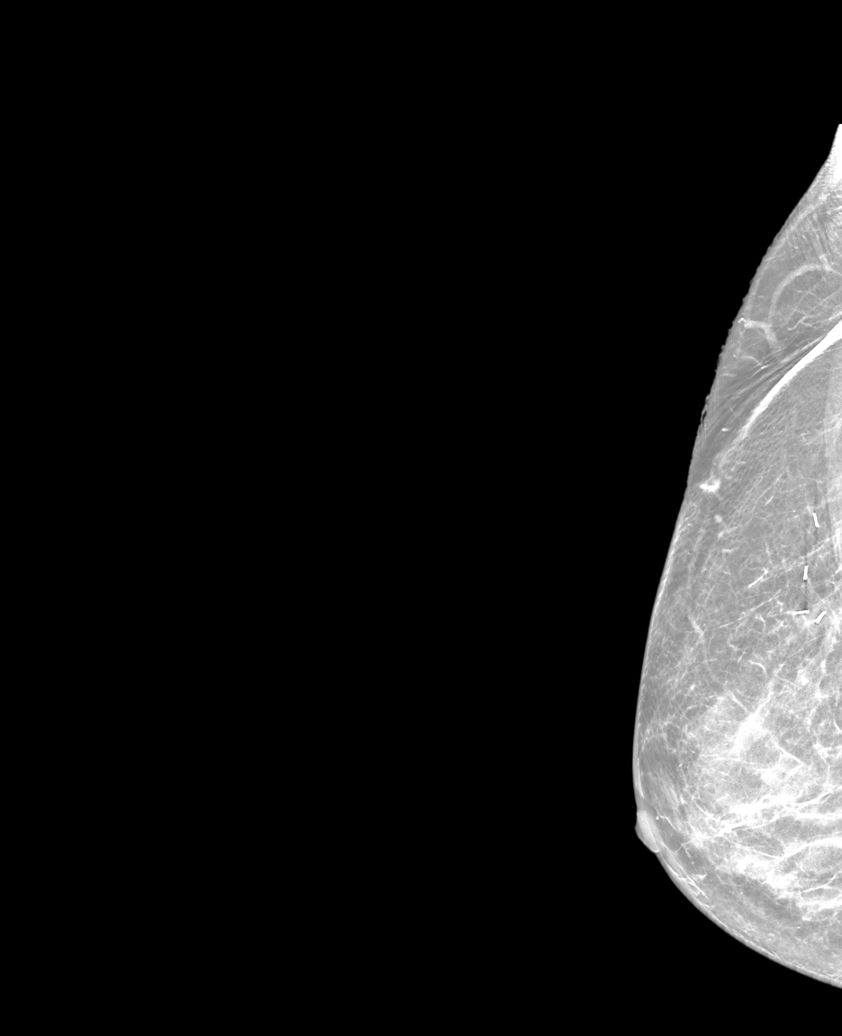

[L MLO synth-2D]
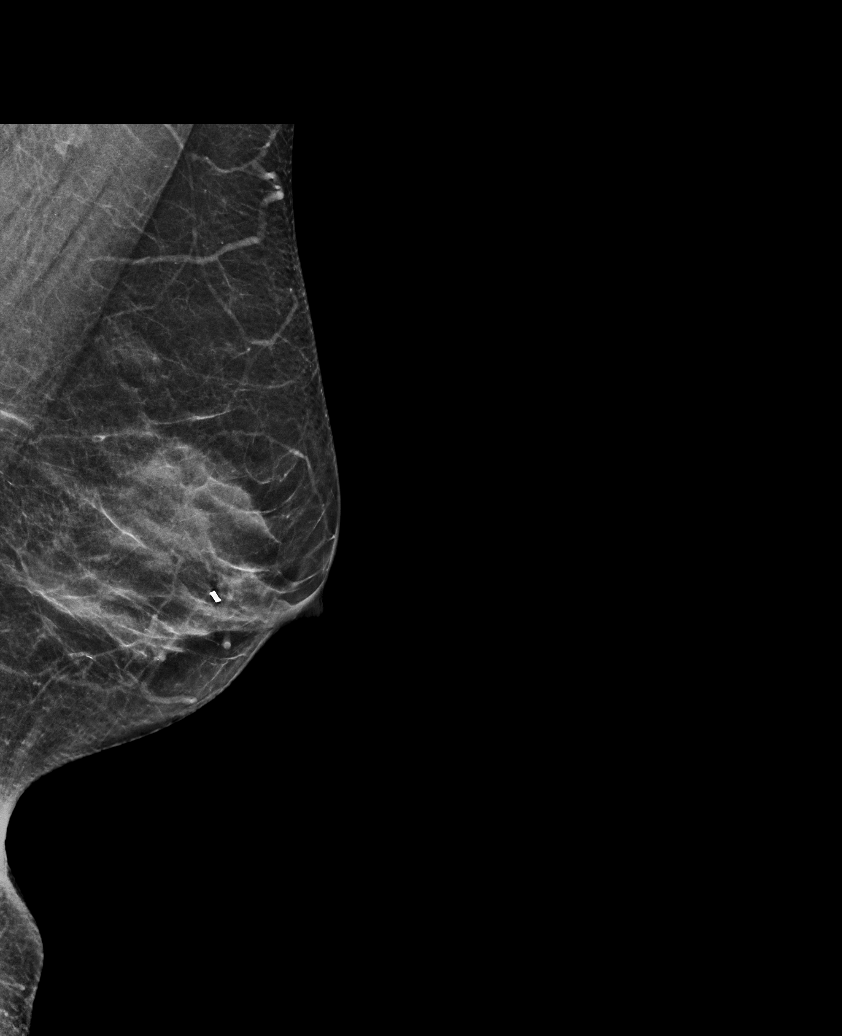

[L CC synth-2D]
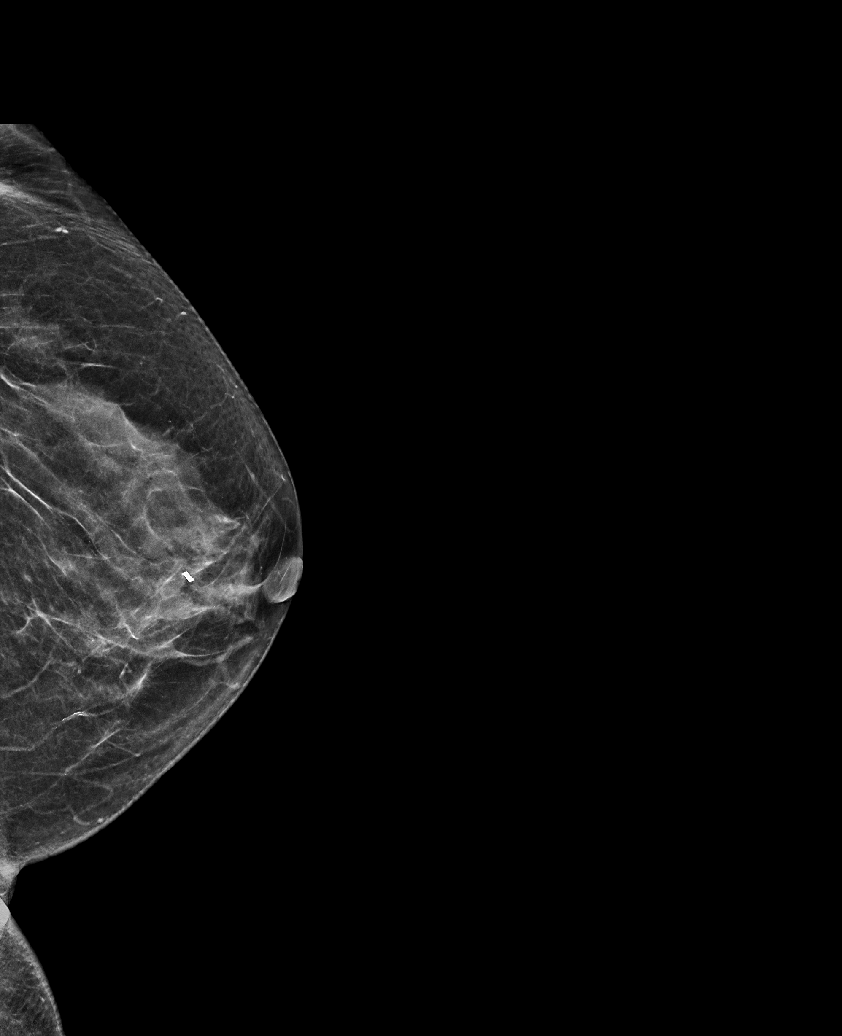

[R CC synth-2D]
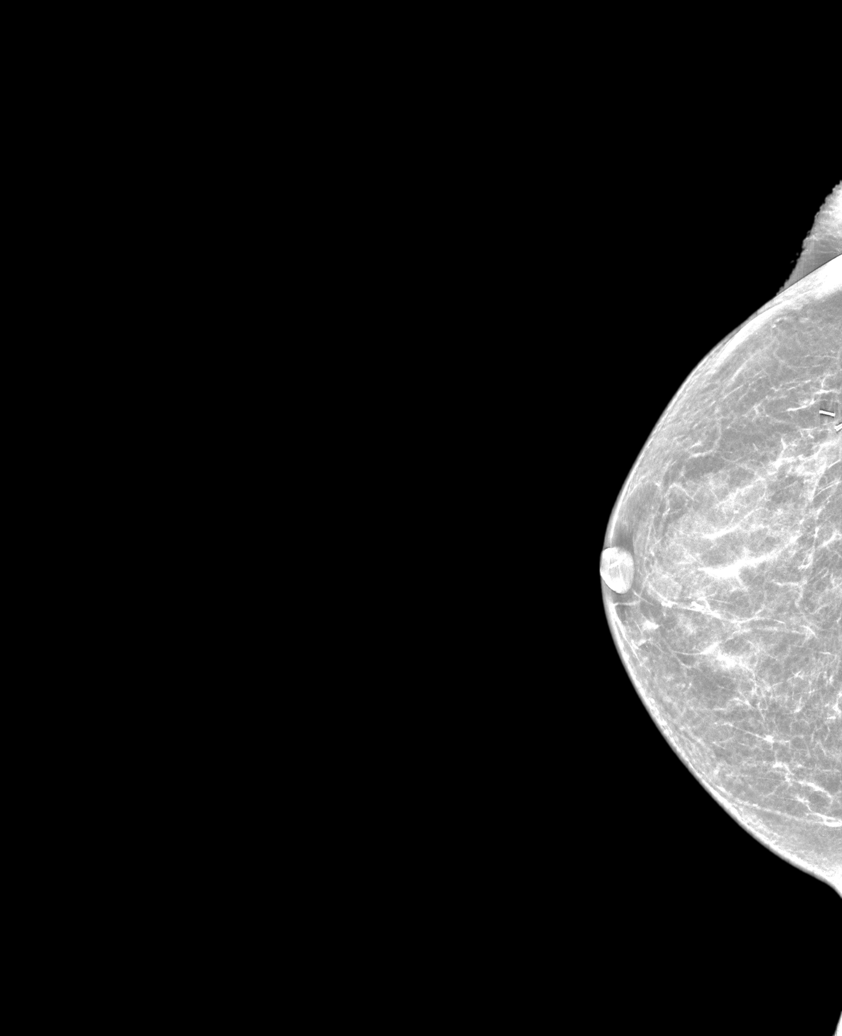

[R MLO tomo · tomo slice 29/57.0]
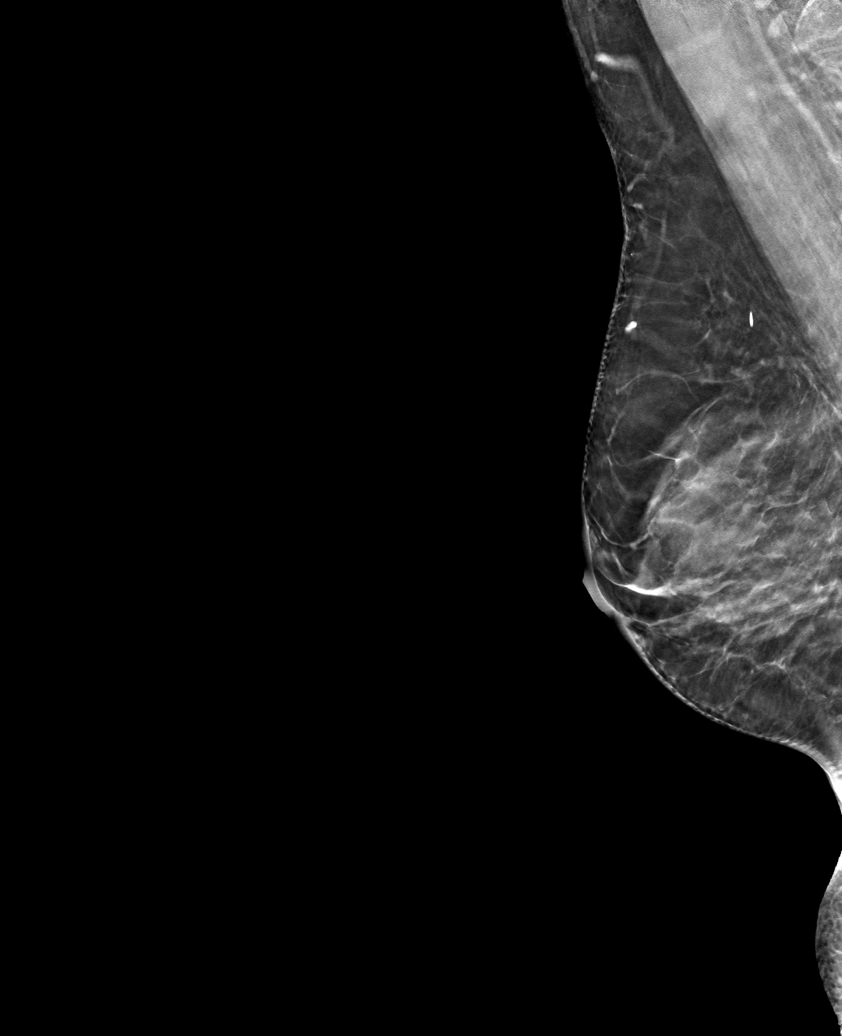

[6 of 30 positions shown; findings below may reference images not displayed]

ACR Breast Density Category c: The breast tissue is heterogeneously
dense, which may obscure small masses.
FINDINGS: There are no findings suspicious for malignancy.
IMPRESSION: No mammographic evidence of malignancy. A result letter of this
screening mammogram will be mailed directly to the patient.

RECOMMENDATION:
Screening mammogram in one year. (Code:[id number])

BI-RADS CATEGORY  1: Negative.

## 2023-01-07 ENCOUNTER — Ambulatory Visit: Payer: Self-pay | Admitting: *Deleted

## 2023-01-07 NOTE — Telephone Encounter (Signed)
  Summary: Sore throat over a week, no appt   Pt called back, says she is feeling better, so doesn't need call back  ----- Message from Randol Kern sent at 01/07/2023  8:45 AM EDT ----- Pt has had a sore throat for over a week, believes it is getting worse. No appt soon enough with PCP, please advise  Best contact: [phone]        Called pt back to verify she was feeling better. States she is, "I thought it was going to my ears but now I feel better."   Advised to call back if any symptoms present. Verbalizes understanding.

## 2023-01-07 NOTE — Telephone Encounter (Signed)
Summary: Sore throat over a week, no appt   Pt called back, says she is feeling better, so doesn't need call back  ----- Message from Randol Kern sent at 01/07/2023  8:45 AM EDT ----- Pt has had a sore throat for over a week, believes it is getting worse. No appt soon enough with PCP, please advise  Best contact: [phone]

## 2023-02-15 DIAGNOSIS — K080 Exfoliation of teeth due to systemic causes: Secondary | ICD-10-CM | POA: Diagnosis not present

## 2023-02-17 ENCOUNTER — Ambulatory Visit: Payer: Medicare Other | Admitting: Dermatology

## 2023-02-17 ENCOUNTER — Encounter: Payer: Self-pay | Admitting: Dermatology

## 2023-02-17 VITALS — BP 120/81

## 2023-02-17 DIAGNOSIS — D229 Melanocytic nevi, unspecified: Secondary | ICD-10-CM

## 2023-02-17 DIAGNOSIS — I779 Disorder of arteries and arterioles, unspecified: Secondary | ICD-10-CM | POA: Diagnosis not present

## 2023-02-17 DIAGNOSIS — Z1283 Encounter for screening for malignant neoplasm of skin: Secondary | ICD-10-CM | POA: Diagnosis not present

## 2023-02-17 DIAGNOSIS — D1801 Hemangioma of skin and subcutaneous tissue: Secondary | ICD-10-CM

## 2023-02-17 DIAGNOSIS — L821 Other seborrheic keratosis: Secondary | ICD-10-CM

## 2023-02-17 DIAGNOSIS — Z7189 Other specified counseling: Secondary | ICD-10-CM

## 2023-02-17 DIAGNOSIS — L814 Other melanin hyperpigmentation: Secondary | ICD-10-CM

## 2023-02-17 DIAGNOSIS — L578 Other skin changes due to chronic exposure to nonionizing radiation: Secondary | ICD-10-CM | POA: Diagnosis not present

## 2023-02-17 DIAGNOSIS — W908XXA Exposure to other nonionizing radiation, initial encounter: Secondary | ICD-10-CM | POA: Diagnosis not present

## 2023-02-17 DIAGNOSIS — Z853 Personal history of malignant neoplasm of breast: Secondary | ICD-10-CM

## 2023-02-17 NOTE — Progress Notes (Signed)
   Follow-Up Visit   Subjective  Alexa Knight is a 67 y.o. female who presents for the following: Skin Cancer Screening and Full Body Skin Exam, no hx of skin cancer, check spots, braline, no symptoms, check spot L nose , hx of pink spot with hx of bump x 43yrs, has bled when picked at, pt has had nose cauterized in past  New patient referral from Dr. Bari Edward.  The patient presents for Total-Body Skin Exam (TBSE) for skin cancer screening and mole check. The patient has spots, moles and lesions to be evaluated, some may be new or changing and the patient may have concern these could be cancer.  The following portions of the chart were reviewed this encounter and updated as appropriate: medications, allergies, medical history  Review of Systems:  No other skin or systemic complaints except as noted in HPI or Assessment and Plan.  Objective  Well appearing patient in no apparent distress; mood and affect are within normal limits.  A full examination was performed including scalp, head, eyes, ears, nose, lips, neck, chest, axillae, abdomen, back, buttocks, bilateral upper extremities, bilateral lower extremities, hands, feet, fingers, toes, fingernails, and toenails. All findings within normal limits unless otherwise noted below.   Relevant physical exam findings are noted in the Assessment and Plan.    Assessment & Plan   SKIN CANCER SCREENING PERFORMED TODAY.  ACTINIC DAMAGE - Chronic condition, secondary to cumulative UV/sun exposure - diffuse scaly erythematous macules with underlying dyspigmentation - Recommend daily broad spectrum sunscreen SPF 30+ to sun-exposed areas, reapply every 2 hours as needed.  - Staying in the shade or wearing long sleeves, sun glasses (UVA+UVB protection) and wide brim hats (4-inch brim around the entire circumference of the hat) are also recommended for sun protection.  - Call for new or changing lesions.  LENTIGINES, SEBORRHEIC KERATOSES,  HEMANGIOMAS - Benign normal skin lesions - Benign-appearing - Call for any changes  MELANOCYTIC NEVI - Tan-brown and/or pink-flesh-colored symmetric macules and papules - Benign appearing on exam today - Observation - Call clinic for new or changing moles - Recommend daily use of broad spectrum spf 30+ sunscreen to sun-exposed areas.   HISTORY OF BREAST CANCER R breast Exam: no lymphadenopathy Treatment Plan: Observe for changes   ARTERIOLE History of bleeding - now calm.  Remote history of nose bleeds needing cauterization. L nasal rim Exam: 3.27mm soft flesh colored pap with hx of bleeding Treatment Plan: Recommend pt seeing a ENT -- will refer.  Disorder of arteriole [facility])  Related Procedures Ambulatory referral to ENT   Return if symptoms worsen or fail to improve.  I, Ardis Rowan, RMA, am acting as scribe for Armida Sans, MD .  Documentation: I have reviewed the above documentation for accuracy and completeness, and I agree with the above.  Armida Sans, MD

## 2023-02-17 NOTE — Patient Instructions (Addendum)
     Melanoma ABCDEs  Melanoma is the most dangerous type of skin cancer, and is the leading cause of death from skin disease.  You are more likely to develop melanoma if you: Have light-colored skin, light-colored eyes, or red or blond hair Spend a lot of time in the sun Tan regularly, either outdoors or in a tanning bed Have had blistering sunburns, especially during childhood Have a close family member who has had a melanoma Have atypical moles or large birthmarks  Early detection of melanoma is key since treatment is typically straightforward and cure rates are extremely high if we catch it early.   The first sign of melanoma is often a change in a mole or a new dark spot.  The ABCDE system is a way of remembering the signs of melanoma.  A for asymmetry:  The two halves do not match. B for border:  The edges of the growth are irregular. C for color:  A mixture of colors are present instead of an even brown color. D for diameter:  Melanomas are usually (but not always) greater than 6mm - the size of a pencil eraser. E for evolution:  The spot keeps changing in size, shape, and color.  Please check your skin once per month between visits. You can use a small mirror in front and a large mirror behind you to keep an eye on the back side or your body.   If you see any new or changing lesions before your next follow-up, please call to schedule a visit.  Please continue daily skin protection including broad spectrum sunscreen SPF 30+ to sun-exposed areas, reapplying every 2 hours as needed when you're outdoors.   Staying in the shade or wearing long sleeves, sun glasses (UVA+UVB protection) and wide brim hats (4-inch brim around the entire circumference of the hat) are also recommended for sun protection.    Due to recent changes in healthcare laws, you may see results of your pathology and/or laboratory studies on MyChart before the doctors have had a chance to review them. We  understand that in some cases there may be results that are confusing or concerning to you. Please understand that not all results are received at the same time and often the doctors may need to interpret multiple results in order to provide you with the best plan of care or course of treatment. Therefore, we ask that you please give us 2 business days to thoroughly review all your results before contacting the office for clarification. Should we see a critical lab result, you will be contacted sooner.   If You Need Anything After Your Visit  If you have any questions or concerns for your doctor, please call our main line at [phone] and press option 4 to reach your doctor's medical assistant. If no one answers, please leave a voicemail as directed and we will return your call as soon as possible. Messages left after 4 pm will be answered the following business day.   You may also send us a message via MyChart. We typically respond to MyChart messages within 1-2 business days.  For prescription refills, please ask your pharmacy to contact our office. Our fax number is [phone].  If you have an urgent issue when the clinic is closed that cannot wait until the next business day, you can page your doctor at the number below.    Please note that while we do our best to be available for urgent issues   outside of office hours, we are not available 24/7.   If you have an urgent issue and are unable to reach us, you may choose to seek medical care at your doctor's office, retail clinic, urgent care center, or emergency room.  If you have a medical emergency, please immediately call 911 or go to the emergency department.  Pager Numbers  - Dr. Kowalski: [phone]  - Dr. Moye: [phone]  - Dr. Stewart: [phone]  In the event of inclement weather, please call our main line at [phone] for an update on the status of any delays or closures.  Dermatology Medication Tips: Please  keep the boxes that topical medications come in in order to help keep track of the instructions about where and how to use these. Pharmacies typically print the medication instructions only on the boxes and not directly on the medication tubes.   If your medication is too expensive, please contact our office at [phone] option 4 or send us a message through MyChart.   We are unable to tell what your co-pay for medications will be in advance as this is different depending on your insurance coverage. However, we may be able to find a substitute medication at lower cost or fill out paperwork to get insurance to cover a needed medication.   If a prior authorization is required to get your medication covered by your insurance company, please allow us 1-2 business days to complete this process.  Drug prices often vary depending on where the prescription is filled and some pharmacies may offer cheaper prices.  The website www.goodrx.com contains coupons for medications through different pharmacies. The prices here do not account for what the cost may be with help from insurance (it may be cheaper with your insurance), but the website can give you the price if you did not use any insurance.  - You can print the associated coupon and take it with your prescription to the pharmacy.  - You may also stop by our office during regular business hours and pick up a GoodRx coupon card.  - If you need your prescription sent electronically to a different pharmacy, notify our office through [facility] MyChart or by phone at [phone] option 4.     Si Usted Necesita Algo Despus de Su Visita  Tambin puede enviarnos un mensaje a travs de MyChart. Por lo general respondemos a los mensajes de MyChart en el transcurso de 1 a 2 das hbiles.  Para renovar recetas, por favor pida a su farmacia que se ponga en contacto con nuestra oficina. Nuestro nmero de fax es el [phone].  Si tiene un asunto urgente  cuando la clnica est cerrada y que no puede esperar hasta el siguiente da hbil, puede llamar/localizar a su doctor(a) al nmero que aparece a continuacin.   Por favor, tenga en cuenta que aunque hacemos todo lo posible para estar disponibles para asuntos urgentes fuera del horario de oficina, no estamos disponibles las 24 horas del da, los 7 das de la semana.   Si tiene un problema urgente y no puede comunicarse con nosotros, puede optar por buscar atencin mdica  en el consultorio de su doctor(a), en una clnica privada, en un centro de atencin urgente o en una sala de emergencias.  Si tiene una emergencia mdica, por favor llame inmediatamente al 911 o vaya a la sala de emergencias.  Nmeros de bper  - Dr. Kowalski: [phone]  - Dra. Moye: [phone]  - Dra. Stewart: [phone]  En caso   de inclemencias del tiempo, por favor llame a nuestra lnea principal al [phone] para una actualizacin sobre el estado de cualquier retraso o cierre.  Consejos para la medicacin en dermatologa: Por favor, guarde las cajas en las que vienen los medicamentos de uso tpico para ayudarle a seguir las instrucciones sobre dnde y cmo usarlos. Las farmacias generalmente imprimen las instrucciones del medicamento slo en las cajas y no directamente en los tubos del medicamento.   Si su medicamento es muy caro, por favor, pngase en contacto con nuestra oficina llamando al [phone] y presione la opcin 4 o envenos un mensaje a travs de MyChart.   No podemos decirle cul ser su copago por los medicamentos por adelantado ya que esto es diferente dependiendo de la cobertura de su seguro. Sin embargo, es posible que podamos encontrar un medicamento sustituto a menor costo o llenar un formulario para que el seguro cubra el medicamento que se considera necesario.   Si se requiere una autorizacin previa para que su compaa de seguros cubra su medicamento, por favor permtanos de 1 a 2  das hbiles para completar este proceso.  Los precios de los medicamentos varan con frecuencia dependiendo del lugar de dnde se surte la receta y alguna farmacias pueden ofrecer precios ms baratos.  El sitio web www.goodrx.com tiene cupones para medicamentos de diferentes farmacias. Los precios aqu no tienen en cuenta lo que podra costar con la ayuda del seguro (puede ser ms barato con su seguro), pero el sitio web puede darle el precio si no utiliz ningn seguro.  - Puede imprimir el cupn correspondiente y llevarlo con su receta a la farmacia.  - Tambin puede pasar por nuestra oficina durante el horario de atencin regular y recoger una tarjeta de cupones de GoodRx.  - Si necesita que su receta se enve electrnicamente a una farmacia diferente, informe a nuestra oficina a travs de MyChart de [place] o por telfono llamando al [phone] y presione la opcin 4.  

## 2023-03-17 ENCOUNTER — Ambulatory Visit (INDEPENDENT_AMBULATORY_CARE_PROVIDER_SITE_OTHER): Payer: Medicare Other

## 2023-03-17 DIAGNOSIS — Z0000 Encounter for general adult medical examination without abnormal findings: Secondary | ICD-10-CM

## 2023-03-17 NOTE — Progress Notes (Signed)
Subjective:   Alexa Knight is a 67 y.o. female who presents for an Initial Medicare Annual Wellness Visit.  Visit Complete: Virtual  I connected with  Alexa Knight on 03/17/23 by a audio enabled telemedicine application and verified that I am speaking with the correct person using two identifiers.  Patient Location: Home  Provider Location: Office/Clinic  I discussed the limitations of evaluation and management by telemedicine. The patient expressed understanding and agreed to proceed.  Vital Signs: Unable to obtain new vitals due to this being a telehealth visit.  Review of Systems     Cardiac Risk Factors include: advanced age (>78men, >83 women);dyslipidemia     Objective:    There were no vitals filed for this visit. There is no height or weight on file to calculate BMI.     03/17/2023    8:59 AM 12/07/2022   10:58 AM 06/08/2022   12:59 PM 12/15/2021    1:07 PM 12/13/2020    2:22 PM  Advanced Directives  Does Patient Have a Medical Advance Directive? No No No No No  Would patient like information on creating a medical advance directive? No - Patient declined No - Patient declined No - Patient declined No - Patient declined Yes (MAU/Ambulatory/Procedural Areas - Information given)    Current Medications (verified) Outpatient Encounter Medications as of 03/17/2023  Medication Sig   anastrozole (ARIMIDEX) 1 MG tablet Take 1 tablet (1 mg total) by mouth daily.   Calcium Carbonate (CALCIUM 500 PO) Take 1,200 mg by mouth daily.   levothyroxine (SYNTHROID) 100 MCG tablet Take 100 mcg by mouth daily before breakfast.    Multiple Vitamin (MULTI-VITAMIN DAILY PO) Take 1 tablet by mouth daily.   omeprazole (PRILOSEC) 20 MG capsule Take 1 capsule (20 mg total) by mouth daily.   No facility-administered encounter medications on file as of 03/17/2023.    Allergies (verified) Patient has no known allergies.   History: Past Medical History:  Diagnosis Date   Breast cancer  (HCC)    Cancer (HCC) 2019   Right breast   Personal history of chemotherapy 2019   Rt breast   Personal history of radiation therapy 2019   Rt breast   Thyroid disease    Past Surgical History:  Procedure Laterality Date   BREAST BIOPSY Left    stereo-neg   BREAST LUMPECTOMY Right 2019   invasive ductal carcinoma   Family History  Problem Relation Age of Onset   Breast cancer Mother 55       d. recurrence 28   Heart attack Father    Prostate cancer Brother    Prostate cancer Brother    Breast cancer Maternal Aunt        dx 109s   Cancer Paternal Aunt        unk type   Social History   Socioeconomic History   Marital status: Married    Spouse name: Alexa Knight   Number of children: 2   Years of education: Not on file   Highest education level: Not on file  Occupational History   Not on file  Tobacco Use   Smoking status: Former    Current packs/day: 0.00    Average packs/day: 1 pack/day for 26.0 years (26.0 ttl pk-yrs)    Types: Cigarettes    Start date: 50    Quit date: 2000    Years since quitting: 24.6   Smokeless tobacco: Never  Vaping Use   Vaping status: Never Used  Substance  and Sexual Activity   Alcohol use: Yes    Comment: once every 2 weeks   Drug use: Never   Sexual activity: Not Currently    Partners: Male  Other Topics Concern   Not on file  Social History Narrative   Not on file   Social Determinants of Health   Financial Resource Strain: Low Risk  (03/17/2023)   Overall Financial Resource Strain (CARDIA)    Difficulty of Paying Living Expenses: Not hard at all  Food Insecurity: No Food Insecurity (03/17/2023)   Hunger Vital Sign    Worried About Running Out of Food in the Last Year: Never true    Ran Out of Food in the Last Year: Never true  Transportation Needs: No Transportation Needs (03/17/2023)   PRAPARE - Administrator, Civil Service (Medical): No    Lack of Transportation (Non-Medical): No  Physical Activity:  Sufficiently Active (03/17/2023)   Exercise Vital Sign    Days of Exercise per Week: 3 days    Minutes of Exercise per Session: 60 min  Stress: No Stress Concern Present (03/17/2023)   [employer] of Occupational Health - Occupational Stress Questionnaire    Feeling of Stress : Not at all  Social Connections: Socially Integrated (03/17/2023)   Social Connection and Isolation Panel [NHANES]    Frequency of Communication with Friends and Family: More than three times a week    Frequency of Social Gatherings with Friends and Family: More than three times a week    Attends Religious Services: More than 4 times per year    Active Member of [employer] or Organizations: Yes    Attends Engineer, structural: More than 4 times per year    Marital Status: Married    Tobacco Counseling Counseling given: Not Answered   Clinical Intake:  Pre-visit preparation completed: Yes  Pain : No/denies pain     Nutritional Risks: None Diabetes: No  How often do you need to have someone help you when you read instructions, pamphlets, or other written materials from your doctor or pharmacy?: 1 - Never  Interpreter Needed?: No  Information entered by :: Kennedy Bucker, LPN   Activities of Daily Living    03/17/2023    9:01 AM 03/13/2023   10:31 AM  In your present state of health, do you have any difficulty performing the following activities:  Hearing? 0 0  Vision? 0 0  Difficulty concentrating or making decisions? 0 0  Walking or climbing stairs? 0 0  Dressing or bathing? 0 0  Doing errands, shopping? 0 0  Preparing Food and eating ? N N  Using the Toilet? N N  In the past six months, have you accidently leaked urine? N N  Do you have problems with loss of bowel control? N N  Managing your Medications? N N  Managing your Finances? N N  Housekeeping or managing your Housekeeping? N N    Patient Care Team: Reubin Milan, MD as PCP - General (Internal Medicine) Sherlon Handing, MD as Consulting Physician (Endocrinology) Creig Hines, MD as Consulting Physician (Oncology) Patterson Hammersmith, MD as Consulting Physician (Rheumatology)  Indicate any recent Medical Services you may have received from other than [facility] providers in the past year (date may be approximate).     Assessment:   This is a routine wellness examination for [place].  Hearing/Vision screen Hearing Screening - Comments:: No aids Vision Screening - Comments:: No glasses- [facility]  Dietary  issues and exercise activities discussed:     Goals Addressed             This Visit's Progress    DIET - EAT MORE FRUITS AND VEGETABLES         Depression Screen    03/17/2023    8:57 AM 10/14/2022    8:40 AM 10/08/2021    8:42 AM 03/07/2021    7:56 AM 10/04/2020    8:34 AM 06/12/2020    1:55 PM  PHQ 2/9 Scores  PHQ - 2 Score 0 0 0 0 0 0  PHQ- 9 Score 0 0 0 0 0 0    Fall Risk    03/17/2023    9:00 AM 03/13/2023   10:31 AM 10/14/2022    8:40 AM 10/08/2021    8:42 AM 03/07/2021    7:56 AM  Fall Risk   Falls in the past year? 1 1 0 0 0  Number falls in past yr: 0 0 0 0 0  Injury with Fall? 0 0 0 0 0  Risk for fall due to : History of fall(s)  No Fall Risks No Fall Risks No Fall Risks  Follow up Falls prevention discussed;Falls evaluation completed  Falls evaluation completed Falls evaluation completed Falls evaluation completed    MEDICARE RISK AT HOME: Medicare Risk at Home Any stairs in or around the home?: Yes If so, are there any without handrails?: No Home free of loose throw rugs in walkways, pet beds, electrical cords, etc?: Yes Adequate lighting in your home to reduce risk of falls?: Yes Life alert?: No Use of a cane, walker or w/c?: No Grab bars in the bathroom?: No Shower chair or bench in shower?: Yes Elevated toilet seat or a handicapped toilet?: Yes  TIMED UP AND GO:  Was the test performed? No    Cognitive Function:        03/17/2023    9:01 AM  6CIT  Screen  What Year? 0 points  What month? 0 points  What time? 0 points  Count back from 20 0 points  Months in reverse 0 points  Repeat phrase 0 points  Total Score 0 points    Immunizations Immunization History  Administered Date(s) Administered   Influenza,inj,Quad PF,6+ Mos 05/30/2018, 06/12/2020   PFIZER(Purple Top)SARS-COV-2 Vaccination 11/17/2019, 12/08/2019   PNEUMOCOCCAL CONJUGATE-20 10/14/2022   Zoster Recombinant(Shingrix) 10/08/2021, 01/28/2022    TDAP status: Due, Education has been provided regarding the importance of this vaccine. Advised may receive this vaccine at local pharmacy or Health Dept. Aware to provide a copy of the vaccination record if obtained from local pharmacy or Health Dept. Verbalized acceptance and understanding.  Flu Vaccine status: Declined, Education has been provided regarding the importance of this vaccine but patient still declined. Advised may receive this vaccine at local pharmacy or Health Dept. Aware to provide a copy of the vaccination record if obtained from local pharmacy or Health Dept. Verbalized acceptance and understanding.  Pneumococcal vaccine status: Up to date  Covid-19 vaccine status: Completed vaccines  Qualifies for Shingles Vaccine? Yes   Zostavax completed No   Shingrix Completed?: Yes  Screening Tests Health Maintenance  Topic Date Due   DTaP/Tdap/Td (1 - Tdap) Never done   Colonoscopy  Never done   COVID-19 Vaccine (3 - Pfizer risk series) 01/05/2020   INFLUENZA VACCINE  02/25/2023   COLON CANCER SCREENING ANNUAL FOBT  10/15/2023   MAMMOGRAM  10/26/2023   Medicare Annual Wellness (AWV)  03/16/2024  Pneumonia Vaccine 35+ Years old  Completed   DEXA SCAN  Completed   Hepatitis C Screening  Completed   Zoster Vaccines- Shingrix  Completed   HPV VACCINES  Aged Out    Health Maintenance  Health Maintenance Due  Topic Date Due   DTaP/Tdap/Td (1 - Tdap) Never done   Colonoscopy  Never done   COVID-19 Vaccine  (3 - Pfizer risk series) 01/05/2020   INFLUENZA VACCINE  02/25/2023    Colorectal cancer screening: Type of screening: FOBT/FIT. Completed 10/15/22. Repeat every 1 years  Mammogram status: Completed 10/26/22. Repeat every year  Bone Density status: Completed 10/26/22. Results reflect: Bone density results: OSTEOPENIA. Repeat every 5 years.  Lung Cancer Screening: (Low Dose CT Chest recommended if Age 20-80 years, 20 pack-year currently smoking OR have quit w/in 15years.) does not qualify.   Additional Screening:  Hepatitis C Screening: does qualify; Completed 10/04/20  Vision Screening: Recommended annual ophthalmology exams for early detection of glaucoma and other disorders of the eye. Is the patient up to date with their annual eye exam?  Yes  Who is the provider or what is the name of the office in which the patient attends annual eye exams? [facility] If pt is not established with a provider, would they like to be referred to a provider to establish care? No .   Dental Screening: Recommended annual dental exams for proper oral hygiene   Community Resource Referral / Chronic Care Management: CRR required this visit?  No   CCM required this visit?  No     Plan:     I have personally reviewed and noted the following in the patient's chart:   Medical and social history Use of alcohol, tobacco or illicit drugs  Current medications and supplements including opioid prescriptions. Patient is not currently taking opioid prescriptions. Functional ability and status Nutritional status Physical activity Advanced directives List of other physicians Hospitalizations, surgeries, and ER visits in previous 12 months Vitals Screenings to include cognitive, depression, and falls Referrals and appointments  In addition, I have reviewed and discussed with patient certain preventive protocols, quality metrics, and best practice recommendations. A written personalized care plan for  preventive services as well as general preventive health recommendations were provided to patient.     Hal Hope, LPN   4/78/2956   After Visit Summary: (MyChart) Due to this being a telephonic visit, the after visit summary with patients personalized plan was offered to patient via MyChart   Nurse Notes: none

## 2023-03-17 NOTE — Patient Instructions (Signed)
Ms. Itkin , Thank you for taking time to come for your Medicare Wellness Visit. I appreciate your ongoing commitment to your health goals. Please review the following plan we discussed and let me know if I can assist you in the future.   Referrals/Orders/Follow-Ups/Clinician Recommendations: none  This is a list of the screening recommended for you and due dates:  Health Maintenance  Topic Date Due   DTaP/Tdap/Td vaccine (1 - Tdap) Never done   Colon Cancer Screening  Never done   COVID-19 Vaccine (3 - Pfizer risk series) 01/05/2020   Flu Shot  02/25/2023   Stool Blood Test  10/15/2023   Mammogram  10/26/2023   Medicare Annual Wellness Visit  03/16/2024   Pneumonia Vaccine  Completed   DEXA scan (bone density measurement)  Completed   Hepatitis C Screening  Completed   Zoster (Shingles) Vaccine  Completed   HPV Vaccine  Aged Out    Advanced directives: (ACP Link)Information on Advanced Care Planning can be found at [facility] Advance Health Care Directives Advance Health Care Directives ([email or web address]   Next Medicare Annual Wellness Visit scheduled for next year: Yes  03/22/24 @ 8:45 am by phone   Insert Preventive Care attachment Insert FALL PREVENTION attachment if needed

## 2023-03-19 DIAGNOSIS — Z23 Encounter for immunization: Secondary | ICD-10-CM | POA: Diagnosis not present

## 2023-03-19 DIAGNOSIS — Z043 Encounter for examination and observation following other accident: Secondary | ICD-10-CM | POA: Diagnosis not present

## 2023-03-19 DIAGNOSIS — M47812 Spondylosis without myelopathy or radiculopathy, cervical region: Secondary | ICD-10-CM | POA: Diagnosis not present

## 2023-03-19 DIAGNOSIS — M542 Cervicalgia: Secondary | ICD-10-CM | POA: Diagnosis not present

## 2023-03-19 DIAGNOSIS — M25512 Pain in left shoulder: Secondary | ICD-10-CM | POA: Diagnosis not present

## 2023-03-19 DIAGNOSIS — W010XXA Fall on same level from slipping, tripping and stumbling without subsequent striking against object, initial encounter: Secondary | ICD-10-CM | POA: Diagnosis not present

## 2023-03-19 DIAGNOSIS — S50312A Abrasion of left elbow, initial encounter: Secondary | ICD-10-CM | POA: Diagnosis not present

## 2023-03-19 DIAGNOSIS — M50323 Other cervical disc degeneration at C6-C7 level: Secondary | ICD-10-CM | POA: Diagnosis not present

## 2023-03-22 DIAGNOSIS — D1801 Hemangioma of skin and subcutaneous tissue: Secondary | ICD-10-CM | POA: Diagnosis not present

## 2023-03-22 DIAGNOSIS — K080 Exfoliation of teeth due to systemic causes: Secondary | ICD-10-CM | POA: Diagnosis not present

## 2023-03-24 DIAGNOSIS — M75102 Unspecified rotator cuff tear or rupture of left shoulder, not specified as traumatic: Secondary | ICD-10-CM | POA: Diagnosis not present

## 2023-06-02 ENCOUNTER — Encounter: Payer: Self-pay | Admitting: Oncology

## 2023-06-02 ENCOUNTER — Inpatient Hospital Stay: Payer: Medicare Other | Attending: Oncology | Admitting: Oncology

## 2023-06-02 VITALS — BP 116/79 | HR 77 | Temp 97.9°F | Resp 18 | Wt 191.9 lb

## 2023-06-02 DIAGNOSIS — Z170 Estrogen receptor positive status [ER+]: Secondary | ICD-10-CM | POA: Diagnosis not present

## 2023-06-02 DIAGNOSIS — M8580 Other specified disorders of bone density and structure, unspecified site: Secondary | ICD-10-CM

## 2023-06-02 DIAGNOSIS — Z87891 Personal history of nicotine dependence: Secondary | ICD-10-CM | POA: Diagnosis not present

## 2023-06-02 DIAGNOSIS — Z803 Family history of malignant neoplasm of breast: Secondary | ICD-10-CM | POA: Insufficient documentation

## 2023-06-02 DIAGNOSIS — Z08 Encounter for follow-up examination after completed treatment for malignant neoplasm: Secondary | ICD-10-CM

## 2023-06-02 DIAGNOSIS — C50411 Malignant neoplasm of upper-outer quadrant of right female breast: Secondary | ICD-10-CM | POA: Diagnosis not present

## 2023-06-02 DIAGNOSIS — M8440XA Pathological fracture, unspecified site, initial encounter for fracture: Secondary | ICD-10-CM | POA: Diagnosis not present

## 2023-06-02 MED ORDER — ANASTROZOLE 1 MG PO TABS: 1.0000 mg | ORAL_TABLET | Freq: Every day | ORAL | 3 refills | Status: DC

## 2023-06-02 NOTE — Assessment & Plan Note (Signed)
10/26/2022 DEXA showed osteopenia. FRAX  10 year major pathological fracture 8.2% Recommend patient to take calcium and vitamin D supplementation.

## 2023-06-02 NOTE — Progress Notes (Signed)
Hematology/Oncology Progress note [phone] [phone]        REFERRING PROVIDER: Reubin Milan, MD    CHIEF COMPLAINTS/PURPOSE OF CONSULTATION:  Right Breast cancer.   ASSESSMENT & PLAN:   Osteopenia 10/26/2022 DEXA showed osteopenia. FRAX  10 year major pathological fracture 8.2% Recommend patient to take calcium and vitamin D supplementation.   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 in female, estrogen receptor positive (HCC) Stage I right breast cancer, T1c N1 M0 ER+, PR+ HER2 negative.  S/p lumpectomy w SLNB, adjuvant chemotherapy and radiation.  Recommend patient to continue endocrine therapy with Arimidex 1mg  daily for at least 5 years, consider extending therapy to 10 years if she tolerates.  Continue annual screening mammogram, next due April 2025   Orders Placed This Encounter  Procedures   MM 3D SCREENING MAMMOGRAM BILATERAL BREAST    Standing Status:   Future    Standing Expiration Date:   06/01/2024    Order Specific Question:   Reason for Exam (SYMPTOM  OR DIAGNOSIS REQUIRED)    Answer:   screening mammo    Order Specific Question:   Preferred imaging location?    Answer:   [facility]   CMP (Cancer Center only)    Standing Status:   Future    Standing Expiration Date:   06/01/2024   CBC with Differential (Cancer Center Only)    Standing Status:   Future    Standing Expiration Date:   06/01/2024   Follow up in 6 months All questions were answered. The patient knows to call the clinic with any problems, questions or concerns.  Rickard Patience, MD, PhD [facility] Hematology Oncology 06/02/2023    HISTORY OF PRESENTING ILLNESS:  Alexa Knight 67 y.o. female presents to establish care for history of right breast cancer.  I have reviewed her chart and materials related to her cancer extensively and collaborated history with the patient. Summary of oncologic history is as follows: Oncology History  Carcinoma of  upper-outer quadrant of right breast in female, estrogen receptor positive (HCC)  07/28/2018 Initial Diagnosis   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 in female, estrogen receptor positive   Per Dr. Assunta Gambles note She had T1cN1a IDC of the right breast that was ER/PR positive and HER-2/neu negative.  Core needle biopsy had showed grade 1 ER 100% positive PR 10% positive and HER-2 negative tumor with a Ki-67 of 5%.  In October 2019 she underwent right lumpectomy with sentinel lymph node dissection which showed a 1.5 cm tumor with positive LVI and 2 out of 2 sentinel lymph nodes that were positive with no evidence of extra nuclear extension.  She received adjuvant dose dense AC chemotherapy and weekly Taxol between December 2019 to May 2020.  She then completed adjuvant radiation therapy and started Arimidex in July 2020.       has history of Hashimoto's thyroiditis for which she is on levothyroxine.  Patient was previously followed by Dr. Smith Robert and switch to me on 06/02/23.  She take Arimidex 1mg  daily, overall she tolerates well with manageable hot flash.  She has noticed occasional right flank/back discomfort several months to approximately 1 year. Lately she has not noticed this discomfort.      MEDICAL HISTORY:  Past Medical History:  Diagnosis Date   Breast cancer (HCC)    Cancer (HCC) 2019   Right breast   Personal history of chemotherapy 2019   Rt breast   Personal history of radiation therapy 2019  Rt breast   Thyroid disease     SURGICAL HISTORY: Past Surgical History:  Procedure Laterality Date   BREAST BIOPSY Left    stereo-neg   BREAST LUMPECTOMY Right 2019   invasive ductal carcinoma    SOCIAL HISTORY: Social History   Socioeconomic History   Marital status: Married    Spouse name: Anne Hahn   Number of children: 2   Years of education: Not on file   Highest education level: Not on file  Occupational History   Not on file  Tobacco Use   Smoking  status: Former    Current packs/day: 0.00    Average packs/day: 1 pack/day for 26.0 years (26.0 ttl pk-yrs)    Types: Cigarettes    Start date: 34    Quit date: 2000    Years since quitting: 24.8   Smokeless tobacco: Never  Vaping Use   Vaping status: Never Used  Substance and Sexual Activity   Alcohol use: Yes    Comment: once every 2 weeks   Drug use: Never   Sexual activity: Not Currently    Partners: Male  Other Topics Concern   Not on file  Social History Narrative   Not on file   Social Determinants of Health   Financial Resource Strain: Low Risk  (03/17/2023)   Overall Financial Resource Strain (CARDIA)    Difficulty of Paying Living Expenses: Not hard at all  Food Insecurity: No Food Insecurity (03/17/2023)   Hunger Vital Sign    Worried About Running Out of Food in the Last Year: Never true    Ran Out of Food in the Last Year: Never true  Transportation Needs: No Transportation Needs (03/17/2023)   PRAPARE - Administrator, Civil Service (Medical): No    Lack of Transportation (Non-Medical): No  Physical Activity: Sufficiently Active (03/17/2023)   Exercise Vital Sign    Days of Exercise per Week: 3 days    Minutes of Exercise per Session: 60 min  Stress: No Stress Concern Present (03/17/2023)   [employer] of Occupational Health - Occupational Stress Questionnaire    Feeling of Stress : Not at all  Social Connections: Socially Integrated (03/17/2023)   Social Connection and Isolation Panel [NHANES]    Frequency of Communication with Friends and Family: More than three times a week    Frequency of Social Gatherings with Friends and Family: More than three times a week    Attends Religious Services: More than 4 times per year    Active Member of [employer] or Organizations: Yes    Attends Engineer, structural: More than 4 times per year    Marital Status: Married  Catering manager Violence: Not At Risk (03/17/2023)   Humiliation, Afraid, Rape,  and Kick questionnaire    Fear of Current or Ex-Partner: No    Emotionally Abused: No    Physically Abused: No    Sexually Abused: No    FAMILY HISTORY: Family History  Problem Relation Age of Onset   Breast cancer Mother 104       d. recurrence 16   Heart attack Father    Prostate cancer Brother    Prostate cancer Brother    Breast cancer Maternal Aunt        dx 50s   Cancer Paternal Aunt        unk type    ALLERGIES:  has No Known Allergies.  MEDICATIONS:  Current Outpatient Medications  Medication Sig Dispense Refill  anastrozole (ARIMIDEX) 1 MG tablet Take 1 tablet (1 mg total) by mouth daily. 90 tablet 3   Calcium Carbonate (CALCIUM 500 PO) Take 1,200 mg by mouth daily.     celecoxib (CELEBREX) 200 MG capsule Take by mouth.     levothyroxine (SYNTHROID) 100 MCG tablet Take 100 mcg by mouth daily before breakfast.      Multiple Vitamin (MULTI-VITAMIN DAILY PO) Take 1 tablet by mouth daily.     omeprazole (PRILOSEC) 20 MG capsule Take 1 capsule (20 mg total) by mouth daily. 90 capsule 3   No current facility-administered medications for this visit.    Review of Systems  Constitutional:  Negative for appetite change, chills, fatigue and fever.  HENT:   Negative for hearing loss and voice change.   Eyes:  Negative for eye problems.  Respiratory:  Negative for chest tightness and cough.   Cardiovascular:  Negative for chest pain.  Gastrointestinal:  Negative for abdominal distention, abdominal pain and blood in stool.  Endocrine: Negative for hot flashes.  Genitourinary:  Negative for difficulty urinating and frequency.   Musculoskeletal:  Negative for arthralgias.  Skin:  Negative for itching and rash.  Neurological:  Negative for extremity weakness.  Hematological:  Negative for adenopathy.  Psychiatric/Behavioral:  Negative for confusion.      PHYSICAL EXAMINATION: ECOG PERFORMANCE STATUS: 0 - Asymptomatic  Vitals:   06/02/23 1052  BP: 116/79  Pulse: 77   Resp: 18  Temp: 97.9 F (36.6 C)  SpO2: 98%   Filed Weights   06/02/23 1052  Weight: 191 lb 14.4 oz (87 kg)    Physical Exam Constitutional:      General: She is not in acute distress.    Appearance: She is not diaphoretic.  HENT:     Head: Normocephalic and atraumatic.  Eyes:     General: No scleral icterus. Cardiovascular:     Rate and Rhythm: Normal rate and regular rhythm.     Heart sounds: No murmur heard. Pulmonary:     Effort: Pulmonary effort is normal. No respiratory distress.  Abdominal:     General: There is no distension.     Palpations: Abdomen is soft.     Tenderness: There is no abdominal tenderness.  Musculoskeletal:        General: Normal range of motion.     Cervical back: Normal range of motion and neck supple.  Skin:    General: Skin is warm and dry.     Findings: No erythema.  Neurological:     Mental Status: She is alert and oriented to person, place, and time.     Cranial Nerves: No cranial nerve deficit.     Motor: No abnormal muscle tone.     Coordination: Coordination normal.  Psychiatric:        Mood and Affect: Affect normal.    Breast exam was performed in seated and lying down position. Patient is status post right lumpectomy with a well-healed surgical scar.   No palpable breast masses bilaterally.  No palpable axillary adenopathy bilaterally.   LABORATORY DATA:  I have reviewed the data as listed    Latest Ref Rng & Units 10/14/2022    9:20 AM 10/08/2021    9:24 AM 09/23/2020   10:18 AM  CBC  WBC 3.4 - 10.8 x10E3/uL 4.8  4.5  4.9   Hemoglobin 11.1 - 15.9 g/dL 78.2  95.6  21.3   Hematocrit 34.0 - 46.6 % 39.9  42.3  38.2  Platelets 150 - 450 x10E3/uL 260  264  270       Latest Ref Rng & Units 10/14/2022    9:20 AM 10/08/2021    9:24 AM 06/12/2020    3:03 PM  CMP  Glucose 70 - 99 mg/dL 85  87  96   BUN 8 - 27 mg/dL 12  10  14    Creatinine 0.57 - 1.00 mg/dL 6.04  5.40  9.81   Sodium 134 - 144 mmol/L 140  140  143    Potassium 3.5 - 5.2 mmol/L 4.6  4.6  4.1   Chloride 96 - 106 mmol/L 103  102  104   CO2 20 - 29 mmol/L 23  26  28    Calcium 8.7 - 10.3 mg/dL 9.2  9.5  9.6   Total Protein 6.0 - 8.5 g/dL 7.2  7.5  7.2   Total Bilirubin 0.0 - 1.2 mg/dL 0.7  0.7  0.4   Alkaline Phos 44 - 121 IU/L 91  74  94   AST 0 - 40 IU/L 22  21  32   ALT 0 - 32 IU/L 18  14  25       RADIOGRAPHIC STUDIES: I have personally reviewed the radiological images as listed and agreed with the findings in the report. No results found.

## 2023-06-02 NOTE — Assessment & Plan Note (Addendum)
Stage I right breast cancer, T1c N1 M0 ER+, PR+ HER2 negative.  S/p lumpectomy w SLNB, adjuvant chemotherapy and radiation.  Recommend patient to continue endocrine therapy with Arimidex 1mg  daily for at least 5 years, consider extending therapy to 10 years if she tolerates.  Continue annual screening mammogram, next due April 2025

## 2023-06-09 ENCOUNTER — Ambulatory Visit: Payer: Medicare Other | Admitting: Oncology

## 2023-06-16 DIAGNOSIS — M75122 Complete rotator cuff tear or rupture of left shoulder, not specified as traumatic: Secondary | ICD-10-CM | POA: Diagnosis not present

## 2023-06-16 DIAGNOSIS — M7522 Bicipital tendinitis, left shoulder: Secondary | ICD-10-CM | POA: Diagnosis not present

## 2023-06-16 DIAGNOSIS — M75102 Unspecified rotator cuff tear or rupture of left shoulder, not specified as traumatic: Secondary | ICD-10-CM | POA: Diagnosis not present

## 2023-06-21 DIAGNOSIS — M7582 Other shoulder lesions, left shoulder: Secondary | ICD-10-CM | POA: Diagnosis not present

## 2023-06-21 DIAGNOSIS — M7522 Bicipital tendinitis, left shoulder: Secondary | ICD-10-CM | POA: Diagnosis not present

## 2023-06-21 DIAGNOSIS — M75122 Complete rotator cuff tear or rupture of left shoulder, not specified as traumatic: Secondary | ICD-10-CM | POA: Diagnosis not present

## 2023-07-03 ENCOUNTER — Other Ambulatory Visit: Payer: Self-pay | Admitting: Oncology

## 2023-07-03 DIAGNOSIS — K219 Gastro-esophageal reflux disease without esophagitis: Secondary | ICD-10-CM

## 2023-07-05 ENCOUNTER — Other Ambulatory Visit: Payer: Self-pay | Admitting: Internal Medicine

## 2023-07-05 DIAGNOSIS — E063 Autoimmune thyroiditis: Secondary | ICD-10-CM | POA: Diagnosis not present

## 2023-07-05 DIAGNOSIS — K219 Gastro-esophageal reflux disease without esophagitis: Secondary | ICD-10-CM

## 2023-07-05 NOTE — Telephone Encounter (Signed)
Dr. Yu's patient

## 2023-07-05 NOTE — Telephone Encounter (Signed)
Medication Refill -  Most Recent Primary Care Visit:  Provider: Hal Hope  Department: [facility]  Visit Type: MEDICARE AWV, INITIAL  Date: 03/17/2023  Medication: omeprazole (PRILOSEC) 20 MG capsule  This denied previously because says prescribed a different Dr ,she says she is seeing a different dr at that ocaiton now no the prescriber  Has the patient contacted their pharmacy? yes ((Agent: If yes, when and what did the pharmacy advise?)contact pcp  Is this the correct pharmacy for this prescription? yes If no, delete pharmacy and type the correct one.  This is the patient's preferred pharmacy:  [facility] [address]), [place] - [address]) [place] [address] Phone: [phone] Fax: [phone]   Has the prescription been filled recently? no  Is the patient out of the medication? Has 3 left  Has the patient been seen for an appointment in the last year OR does the patient have an upcoming appointment? yes  Can we respond through MyChart? yes  Agent: Please be advised that Rx refills may take up to 3 business days. We ask that you follow-up with your pharmacy.

## 2023-07-06 NOTE — Telephone Encounter (Signed)
Requested medication (s) are due for refill today: expired medication   Requested medication (s) are on the active medication list: yes   Last refill:  06/18/22 #90 3 refills   Future visit scheduled: yes in 3 months   Notes to clinic:   expired medication . Last ordered by Owens Shark, MD 06/18/22. Do you want to order Rx? Patient requesting refills     Requested Prescriptions  Pending Prescriptions Disp Refills   omeprazole (PRILOSEC) 20 MG capsule 90 capsule 3    Sig: Take 1 capsule (20 mg total) by mouth daily.     Gastroenterology: Proton Pump Inhibitors Passed - 07/05/2023  3:42 PM      Passed - Valid encounter within last 12 months    Recent Outpatient Visits           8 months ago Annual physical exam   [place] [facility] at [facility], Nyoka Cowden, MD   1 year ago Annual physical exam   [facility] at [facility], Nyoka Cowden, MD   2 years ago Hemorrhagic cystitis   [place] [facility] at [facility], Ocie Bob, MD   2 years ago Annual physical exam   [facility] at [facility], Nyoka Cowden, MD   3 years ago 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 in female, estrogen receptor positive [facility])   [place] [facility] at [facility], Nyoka Cowden, MD       Future Appointments             In 3 months Judithann Graves, Nyoka Cowden, MD [facility] at [facility], [facility]

## 2023-07-06 NOTE — Telephone Encounter (Signed)
Patient needs appt. Thank you

## 2023-07-08 ENCOUNTER — Other Ambulatory Visit: Payer: Self-pay

## 2023-07-08 DIAGNOSIS — K219 Gastro-esophageal reflux disease without esophagitis: Secondary | ICD-10-CM

## 2023-07-08 MED ORDER — OMEPRAZOLE 20 MG PO CPDR: 20.0000 mg | DELAYED_RELEASE_CAPSULE | Freq: Every day | ORAL | 0 refills | Status: DC

## 2023-07-08 NOTE — Telephone Encounter (Signed)
Med refilled.

## 2023-07-08 NOTE — Telephone Encounter (Signed)
Patient has called stating she called on Monday 07/05/23 for refill of medication omeprazole (PRILOSEC) 20 MG capsule.  Patient states she does not get this from Oncology, that they refilled this as a courtesy from her last visit with them and she said the original prescriber was Dr Judithann Graves. It is notated patient needs an appointment but does patient need an appointment before any refills are given? Patient was last seen on 10/14/2022 for a Physical and patient is scheduled for a Physical on 10/18/2023 with PCP. Patient states she will contact Pharmacy as well.  Patient states she is completely out of medication. Please advise and follow back up with the patient at phone # [phone]

## 2023-07-12 DIAGNOSIS — E042 Nontoxic multinodular goiter: Secondary | ICD-10-CM | POA: Diagnosis not present

## 2023-07-12 DIAGNOSIS — E063 Autoimmune thyroiditis: Secondary | ICD-10-CM | POA: Diagnosis not present

## 2023-08-10 ENCOUNTER — Other Ambulatory Visit: Payer: Self-pay | Admitting: Surgery

## 2023-08-11 ENCOUNTER — Other Ambulatory Visit: Payer: Self-pay

## 2023-08-11 ENCOUNTER — Encounter
Admission: RE | Admit: 2023-08-11 | Discharge: 2023-08-11 | Disposition: A | Discharge status: Home / Self Care | Payer: Medicare Other | Source: Ambulatory Visit | Attending: Surgery | Admitting: Surgery

## 2023-08-11 VITALS — Ht 65.5 in | Wt 193.0 lb

## 2023-08-11 DIAGNOSIS — Z170 Estrogen receptor positive status [ER+]: Secondary | ICD-10-CM | POA: Diagnosis not present

## 2023-08-11 DIAGNOSIS — Z01818 Encounter for other preprocedural examination: Secondary | ICD-10-CM | POA: Insufficient documentation

## 2023-08-11 DIAGNOSIS — E782 Mixed hyperlipidemia: Secondary | ICD-10-CM | POA: Insufficient documentation

## 2023-08-11 DIAGNOSIS — E042 Nontoxic multinodular goiter: Secondary | ICD-10-CM

## 2023-08-11 DIAGNOSIS — Z01810 Encounter for preprocedural cardiovascular examination: Secondary | ICD-10-CM

## 2023-08-11 DIAGNOSIS — Z01812 Encounter for preprocedural laboratory examination: Secondary | ICD-10-CM

## 2023-08-11 DIAGNOSIS — C50411 Malignant neoplasm of upper-outer quadrant of right female breast: Secondary | ICD-10-CM | POA: Insufficient documentation

## 2023-08-11 HISTORY — DX: Long term (current) use of aromatase inhibitors: Z79.811

## 2023-08-11 HISTORY — DX: Raynaud's syndrome without gangrene: I73.00

## 2023-08-11 HISTORY — DX: Hypothyroidism, unspecified: E03.9

## 2023-08-11 HISTORY — DX: Other specified disorders of bone density and structure, unspecified site: M85.80

## 2023-08-11 HISTORY — DX: Nontoxic multinodular goiter: E04.2

## 2023-08-11 HISTORY — DX: Autoimmune thyroiditis: E06.3

## 2023-08-11 HISTORY — DX: Other specified abnormal immunological findings in serum: R76.8

## 2023-08-11 HISTORY — DX: Mixed hyperlipidemia: E78.2

## 2023-08-11 LAB — CBC
HCT: 39.1 % (ref 36.0–46.0)
Hemoglobin: 12.9 g/dL (ref 12.0–15.0)
MCH: 29.9 pg (ref 26.0–34.0)
MCHC: 33 g/dL (ref 30.0–36.0)
MCV: 90.5 fL (ref 80.0–100.0)
Platelets: 272 10*3/uL (ref 150–400)
RBC: 4.32 MIL/uL (ref 3.87–5.11)
RDW: 12.5 % (ref 11.5–15.5)
WBC: 5.4 10*3/uL (ref 4.0–10.5)
nRBC: 0 % (ref 0.0–0.2)

## 2023-08-11 LAB — BASIC METABOLIC PANEL
Anion gap: 8 (ref 5–15)
BUN: 12 mg/dL (ref 8–23)
CO2: 26 mmol/L (ref 22–32)
Calcium: 9.4 mg/dL (ref 8.9–10.3)
Chloride: 102 mmol/L (ref 98–111)
Creatinine, Ser: 0.65 mg/dL (ref 0.44–1.00)
GFR, Estimated: >60 mL/min (ref >60)
Glucose, Bld: 101 mg/dL — ABNORMAL HIGH (ref 70–99)
Potassium: 3.7 mmol/L (ref 3.5–5.1)
Sodium: 136 mmol/L (ref 135–145)

## 2023-08-11 NOTE — Patient Instructions (Addendum)
 Your procedure is scheduled on: Tuesday, January 21 Report to the Registration Desk on the 1st floor of the [facility] To find out your arrival time, please call [phone] between 1PM - 3PM on: Monday, January 20 If your arrival time is 6:00 am, do not arrive before that time as the [facility] entrance doors do not open until 6:00 am.  REMEMBER: Instructions that are not followed completely may result in serious medical risk, up to and including death; or upon the discretion of your surgeon and anesthesiologist your surgery may need to be rescheduled.  Do not eat food after midnight the night before surgery.  No gum chewing or hard candies.  You may however, drink CLEAR liquids up to 2 hours before you are scheduled to arrive for your surgery. Do not drink anything within 2 hours of your scheduled arrival time.  Clear liquids include: - water  - apple juice without pulp - gatorade (not RED colors) - black coffee or tea (Do NOT add milk or creamers to the coffee or tea) Do NOT drink anything that is not on this list.  In addition, your doctor has ordered for you to drink the provided:  Ensure Pre-Surgery Clear Carbohydrate Drink  Drinking this carbohydrate drink up to two hours before surgery helps to reduce insulin resistance and improve patient outcomes. Please complete drinking 2 hours before scheduled arrival time.  One week prior to surgery: starting January 15 Stop Anti-inflammatories (NSAIDS) such as Advil, Aleve, Ibuprofen, Motrin, Naproxen, Naprosyn and Aspirin based products such as Excedrin, Goody's Powder, BC Powder. Stop ANY OVER THE COUNTER supplements until after surgery. Stop calcium, multiple vitamins.  You may however, continue to take Tylenol  if needed for pain up until the day of surgery.  Continue taking all of your other prescription medications up until the day of surgery.  ON THE DAY OF SURGERY ONLY TAKE THESE MEDICATIONS WITH SIPS OF  WATER:  anastrozole  (ARIMIDEX )  levothyroxine (SYNTHROID)  omeprazole  (PRILOSEC)   No Alcohol for 24 hours before or after surgery.  No Smoking including e-cigarettes for 24 hours before surgery.  No chewable tobacco products for at least 6 hours before surgery.  No nicotine patches on the day of surgery.  Do not use any "recreational" drugs for at least a week (preferably 2 weeks) before your surgery.  Please be advised that the combination of cocaine and anesthesia may have negative outcomes, up to and including death. If you test positive for cocaine, your surgery will be cancelled.  On the morning of surgery brush your teeth with toothpaste and water, you may rinse your mouth with mouthwash if you wish. Do not swallow any toothpaste or mouthwash.  Use CHG Soap as directed on instruction sheet.  Do not wear jewelry, make-up, hairpins, clips or nail polish.  For welded (permanent) jewelry: bracelets, anklets, waist bands, etc.  Please have this removed prior to surgery.  If it is not removed, there is a chance that hospital personnel will need to cut it off on the day of surgery.  Do not wear lotions, powders, or perfumes.   Do not shave body hair from the neck down 48 hours before surgery.  Contact lenses, hearing aids and dentures may not be worn into surgery.  Do not bring valuables to the hospital. [facility] is not responsible for any missing/lost belongings or valuables.   Notify your doctor if there is any change in your medical condition (cold, fever, infection).  Wear comfortable clothing (  specific to your surgery type) to the hospital.  After surgery, you can help prevent lung complications by doing breathing exercises.  Take deep breaths and cough every 1-2 hours. Your doctor may order a device called an Incentive Spirometer to help you take deep breaths.  If you are being discharged the day of surgery, you will not be allowed to drive home. You will need a  responsible individual to drive you home and stay with you for 24 hours after surgery.   If you are taking public transportation, you will need to have a responsible individual with you.  Please call the Pre-admissions Testing Dept. at [phone] if you have any questions about these instructions.  Surgery Visitation Policy:  Patients having surgery or a procedure may have two visitors.  Children under the age of 33 must have an adult with them who is not the patient.  Temporary Visitor Restrictions Due to increasing cases of flu, RSV and COVID-19: Children ages 57 and under will not be able to visit patients in [facility] hospitals under most circumstances.      Preparing for Surgery with CHLORHEXIDINE  GLUCONATE (CHG) Soap  Chlorhexidine  Gluconate (CHG) Soap  o An antiseptic cleaner that kills germs and bonds with the skin to continue killing germs even after washing  o Used for showering the night before surgery and morning of surgery  Before surgery, you can play an important role by reducing the number of germs on your skin.  CHG (Chlorhexidine  gluconate) soap is an antiseptic cleanser which kills germs and bonds with the skin to continue killing germs even after washing.  Please do not use if you have an allergy to CHG or antibacterial soaps. If your skin becomes reddened/irritated stop using the CHG.  1. Shower the NIGHT BEFORE SURGERY and the MORNING OF SURGERY with CHG soap.  2. If you choose to wash your hair, wash your hair first as usual with your normal shampoo.  3. After shampooing, rinse your hair and body thoroughly to remove the shampoo.  4. Use CHG as you would any other liquid soap. You can apply CHG directly to the skin and wash gently with a scrungie or a clean washcloth.  5. Apply the CHG soap to your body only from the neck down. Do not use on open wounds or open sores. Avoid contact with your eyes, ears, mouth, and genitals (private parts). Wash  face and genitals (private parts) with your normal soap.  6. Wash thoroughly, paying special attention to the area where your surgery will be performed.  7. Thoroughly rinse your body with warm water.  8. Do not shower/wash with your normal soap after using and rinsing off the CHG soap.  9. Pat yourself dry with a clean towel.  10. Wear clean pajamas to bed the night before surgery.  12. Place clean sheets on your bed the night of your first shower and do not sleep with pets.  13. Shower again with the CHG soap on the day of surgery prior to arriving at the hospital.  14. Do not apply any deodorants/lotions/powders.  15. Please wear clean clothes to the hospital.

## 2023-08-13 DIAGNOSIS — M7582 Other shoulder lesions, left shoulder: Secondary | ICD-10-CM | POA: Diagnosis not present

## 2023-08-13 DIAGNOSIS — S46012D Strain of muscle(s) and tendon(s) of the rotator cuff of left shoulder, subsequent encounter: Secondary | ICD-10-CM | POA: Diagnosis not present

## 2023-08-13 DIAGNOSIS — M7522 Bicipital tendinitis, left shoulder: Secondary | ICD-10-CM | POA: Diagnosis not present

## 2023-08-17 ENCOUNTER — Ambulatory Visit: Payer: Medicare Other

## 2023-08-17 ENCOUNTER — Other Ambulatory Visit: Payer: Self-pay

## 2023-08-17 ENCOUNTER — Ambulatory Visit
Admission: RE | Admit: 2023-08-17 | Discharge: 2023-08-17 | Disposition: A | Discharge status: Home / Self Care | Payer: Medicare Other | Attending: Surgery | Admitting: Surgery

## 2023-08-17 ENCOUNTER — Encounter
Admission: RE | Disposition: A | Discharge status: Home / Self Care | Payer: Self-pay | Source: Home / Self Care | Attending: Surgery

## 2023-08-17 ENCOUNTER — Ambulatory Visit: Payer: Medicare Other | Admitting: Urgent Care

## 2023-08-17 ENCOUNTER — Encounter: Payer: Self-pay | Admitting: Surgery

## 2023-08-17 ENCOUNTER — Ambulatory Visit: Payer: Medicare Other | Admitting: Anesthesiology

## 2023-08-17 DIAGNOSIS — Z853 Personal history of malignant neoplasm of breast: Secondary | ICD-10-CM | POA: Diagnosis not present

## 2023-08-17 DIAGNOSIS — M7582 Other shoulder lesions, left shoulder: Secondary | ICD-10-CM | POA: Diagnosis not present

## 2023-08-17 DIAGNOSIS — M25812 Other specified joint disorders, left shoulder: Secondary | ICD-10-CM | POA: Insufficient documentation

## 2023-08-17 DIAGNOSIS — M65912 Unspecified synovitis and tenosynovitis, left shoulder: Secondary | ICD-10-CM | POA: Insufficient documentation

## 2023-08-17 DIAGNOSIS — E669 Obesity, unspecified: Secondary | ICD-10-CM | POA: Diagnosis not present

## 2023-08-17 DIAGNOSIS — K219 Gastro-esophageal reflux disease without esophagitis: Secondary | ICD-10-CM | POA: Insufficient documentation

## 2023-08-17 DIAGNOSIS — Z6831 Body mass index (BMI) 31.0-31.9, adult: Secondary | ICD-10-CM | POA: Insufficient documentation

## 2023-08-17 DIAGNOSIS — Z87891 Personal history of nicotine dependence: Secondary | ICD-10-CM | POA: Diagnosis not present

## 2023-08-17 DIAGNOSIS — M75122 Complete rotator cuff tear or rupture of left shoulder, not specified as traumatic: Secondary | ICD-10-CM | POA: Diagnosis not present

## 2023-08-17 DIAGNOSIS — X58XXXA Exposure to other specified factors, initial encounter: Secondary | ICD-10-CM | POA: Diagnosis not present

## 2023-08-17 DIAGNOSIS — M24112 Other articular cartilage disorders, left shoulder: Secondary | ICD-10-CM | POA: Diagnosis not present

## 2023-08-17 DIAGNOSIS — S46012A Strain of muscle(s) and tendon(s) of the rotator cuff of left shoulder, initial encounter: Secondary | ICD-10-CM | POA: Diagnosis not present

## 2023-08-17 DIAGNOSIS — I7300 Raynaud's syndrome without gangrene: Secondary | ICD-10-CM | POA: Insufficient documentation

## 2023-08-17 DIAGNOSIS — M7522 Bicipital tendinitis, left shoulder: Secondary | ICD-10-CM | POA: Diagnosis not present

## 2023-08-17 DIAGNOSIS — M19012 Primary osteoarthritis, left shoulder: Secondary | ICD-10-CM | POA: Insufficient documentation

## 2023-08-17 DIAGNOSIS — G8918 Other acute postprocedural pain: Secondary | ICD-10-CM | POA: Diagnosis not present

## 2023-08-17 DIAGNOSIS — M7542 Impingement syndrome of left shoulder: Secondary | ICD-10-CM | POA: Diagnosis not present

## 2023-08-17 HISTORY — PX: SHOULDER ARTHROSCOPY WITH SUBACROMIAL DECOMPRESSION, ROTATOR CUFF REPAIR AND BICEP TENDON REPAIR: SHX5687

## 2023-08-17 SURGERY — SHOULDER ARTHROSCOPY WITH SUBACROMIAL DECOMPRESSION, ROTATOR CUFF REPAIR AND BICEP TENDON REPAIR
Anesthesia: Regional | Site: Shoulder | Laterality: Left

## 2023-08-17 MED ORDER — FENTANYL CITRATE (PF) 100 MCG/2ML IJ SOLN
INTRAMUSCULAR | Status: AC
  Filled 2023-08-17: qty 2

## 2023-08-17 MED ORDER — PHENYLEPHRINE 80 MCG/ML (10ML) SYRINGE FOR IV PUSH (FOR BLOOD PRESSURE SUPPORT)
PREFILLED_SYRINGE | INTRAVENOUS | Status: DC | PRN
  Administered 2023-08-17: 80 ug via INTRAVENOUS

## 2023-08-17 MED ORDER — CHLORHEXIDINE GLUCONATE 0.12 % MT SOLN
OROMUCOSAL | Status: AC
  Filled 2023-08-17: qty 15

## 2023-08-17 MED ORDER — ROCURONIUM BROMIDE 100 MG/10ML IV SOLN
INTRAVENOUS | Status: DC | PRN
  Administered 2023-08-17: 10 mg via INTRAVENOUS
  Administered 2023-08-17: 50 mg via INTRAVENOUS

## 2023-08-17 MED ORDER — ACETAMINOPHEN 10 MG/ML IV SOLN: 1000.0000 mg | Freq: Once | INTRAVENOUS | Status: DC | PRN

## 2023-08-17 MED ORDER — BUPIVACAINE LIPOSOME 1.3 % IJ SUSP
INTRAMUSCULAR | Status: DC | PRN
  Administered 2023-08-17: 20 mL

## 2023-08-17 MED ORDER — KETOROLAC TROMETHAMINE 30 MG/ML IJ SOLN
INTRAMUSCULAR | Status: DC | PRN
  Administered 2023-08-17: 15 mg via INTRAVENOUS

## 2023-08-17 MED ORDER — ONDANSETRON HCL 4 MG PO TABS: 4.0000 mg | ORAL_TABLET | Freq: Four times a day (QID) | ORAL | Status: DC | PRN

## 2023-08-17 MED ORDER — ORAL CARE MOUTH RINSE: 15.0000 mL | Freq: Once | OROMUCOSAL | Status: AC

## 2023-08-17 MED ORDER — METOCLOPRAMIDE HCL 10 MG PO TABS: 5.0000 mg | ORAL_TABLET | Freq: Three times a day (TID) | ORAL | Status: DC | PRN

## 2023-08-17 MED ORDER — DEXAMETHASONE SODIUM PHOSPHATE 10 MG/ML IJ SOLN
INTRAMUSCULAR | Status: DC | PRN
  Administered 2023-08-17: 10 mg via INTRAVENOUS

## 2023-08-17 MED ORDER — OXYCODONE HCL 5 MG PO TABS: 5.0000 mg | ORAL_TABLET | ORAL | Status: DC | PRN

## 2023-08-17 MED ORDER — CHLORHEXIDINE GLUCONATE 0.12 % MT SOLN
15.0000 mL | Freq: Once | OROMUCOSAL | Status: AC
  Administered 2023-08-17: 15 mL via OROMUCOSAL

## 2023-08-17 MED ORDER — BUPIVACAINE HCL (PF) 0.5 % IJ SOLN
INTRAMUSCULAR | Status: DC | PRN
  Administered 2023-08-17: 10 mL

## 2023-08-17 MED ORDER — BUPIVACAINE LIPOSOME 1.3 % IJ SUSP
INTRAMUSCULAR | Status: AC
  Filled 2023-08-17: qty 20

## 2023-08-17 MED ORDER — DEXTROSE-SODIUM CHLORIDE 5-0.9 % IV SOLN: INTRAVENOUS | Status: DC

## 2023-08-17 MED ORDER — LIDOCAINE HCL (CARDIAC) PF 100 MG/5ML IV SOSY
PREFILLED_SYRINGE | INTRAVENOUS | Status: DC | PRN
  Administered 2023-08-17: 80 mg via INTRAVENOUS

## 2023-08-17 MED ORDER — METOCLOPRAMIDE HCL 5 MG/ML IJ SOLN: 5.0000 mg | Freq: Three times a day (TID) | INTRAMUSCULAR | Status: DC | PRN

## 2023-08-17 MED ORDER — ONDANSETRON HCL 4 MG/2ML IJ SOLN
INTRAMUSCULAR | Status: DC | PRN
  Administered 2023-08-17: 4 mg via INTRAVENOUS

## 2023-08-17 MED ORDER — MIDAZOLAM HCL 2 MG/2ML IJ SOLN
INTRAMUSCULAR | Status: AC
Start: 2023-08-17 — End: ?
  Filled 2023-08-17: qty 2

## 2023-08-17 MED ORDER — PHENYLEPHRINE HCL-NACL 20-0.9 MG/250ML-% IV SOLN
INTRAVENOUS | Status: AC
  Filled 2023-08-17: qty 250

## 2023-08-17 MED ORDER — PHENYLEPHRINE HCL-NACL 20-0.9 MG/250ML-% IV SOLN
INTRAVENOUS | Status: DC | PRN
  Administered 2023-08-17: 20 ug/min via INTRAVENOUS

## 2023-08-17 MED ORDER — ONDANSETRON HCL 4 MG/2ML IJ SOLN
INTRAMUSCULAR | Status: AC
  Filled 2023-08-17: qty 2

## 2023-08-17 MED ORDER — LACTATED RINGERS IV SOLN
INTRAVENOUS | Status: DC
Start: 2023-08-17 — End: 2023-08-17

## 2023-08-17 MED ORDER — OXYCODONE HCL 5 MG/5ML PO SOLN: 5.0000 mg | Freq: Once | ORAL | Status: DC | PRN

## 2023-08-17 MED ORDER — EPHEDRINE 5 MG/ML INJ
INTRAVENOUS | Status: AC
  Filled 2023-08-17: qty 5

## 2023-08-17 MED ORDER — FENTANYL CITRATE (PF) 100 MCG/2ML IJ SOLN
INTRAMUSCULAR | Status: DC | PRN
  Administered 2023-08-17 (×2): 50 ug via INTRAVENOUS

## 2023-08-17 MED ORDER — MIDAZOLAM HCL 2 MG/2ML IJ SOLN
1.0000 mg | INTRAMUSCULAR | Status: DC | PRN
  Administered 2023-08-17: 2 mg via INTRAVENOUS

## 2023-08-17 MED ORDER — EPINEPHRINE PF 1 MG/ML IJ SOLN
INTRAMUSCULAR | Status: AC
  Filled 2023-08-17: qty 1

## 2023-08-17 MED ORDER — ACETAMINOPHEN 325 MG PO TABS: 325.0000 mg | ORAL_TABLET | Freq: Four times a day (QID) | ORAL | Status: DC | PRN

## 2023-08-17 MED ORDER — SUGAMMADEX SODIUM 500 MG/5ML IV SOLN
INTRAVENOUS | Status: DC | PRN
  Administered 2023-08-17: 50 mg via INTRAVENOUS
  Administered 2023-08-17: 200 mg via INTRAVENOUS

## 2023-08-17 MED ORDER — PROPOFOL 10 MG/ML IV BOLUS
INTRAVENOUS | Status: DC | PRN
  Administered 2023-08-17: 140 mg via INTRAVENOUS
  Administered 2023-08-17: 60 mg via INTRAVENOUS

## 2023-08-17 MED ORDER — BUPIVACAINE HCL (PF) 0.5 % IJ SOLN
INTRAMUSCULAR | Status: AC
  Filled 2023-08-17: qty 10

## 2023-08-17 MED ORDER — KETOROLAC TROMETHAMINE 30 MG/ML IJ SOLN
INTRAMUSCULAR | Status: AC
Start: 2023-08-17 — End: ?
  Filled 2023-08-17: qty 1

## 2023-08-17 MED ORDER — FENTANYL CITRATE PF 50 MCG/ML IJ SOSY
50.0000 ug | PREFILLED_SYRINGE | Freq: Once | INTRAMUSCULAR | Status: AC
Start: 2023-08-17 — End: 2023-08-17
  Administered 2023-08-17: 50 ug via INTRAVENOUS

## 2023-08-17 MED ORDER — FENTANYL CITRATE (PF) 100 MCG/2ML IJ SOLN: 25.0000 ug | INTRAMUSCULAR | Status: DC | PRN

## 2023-08-17 MED ORDER — ONDANSETRON HCL 4 MG/2ML IJ SOLN: 4.0000 mg | Freq: Four times a day (QID) | INTRAMUSCULAR | Status: DC | PRN

## 2023-08-17 MED ORDER — LACTATED RINGERS IV SOLN: INTRAVENOUS | Status: DC | PRN

## 2023-08-17 MED ORDER — ALBUTEROL SULFATE HFA 108 (90 BASE) MCG/ACT IN AERS
INHALATION_SPRAY | RESPIRATORY_TRACT | Status: AC
  Filled 2023-08-17: qty 6.7

## 2023-08-17 MED ORDER — CEFAZOLIN SODIUM-DEXTROSE 2-4 GM/100ML-% IV SOLN
2.0000 g | INTRAVENOUS | Status: AC
  Administered 2023-08-17: 2 g via INTRAVENOUS

## 2023-08-17 MED ORDER — DEXAMETHASONE SODIUM PHOSPHATE 10 MG/ML IJ SOLN
INTRAMUSCULAR | Status: AC
  Filled 2023-08-17: qty 1

## 2023-08-17 MED ORDER — ONDANSETRON HCL 4 MG/2ML IJ SOLN: 4.0000 mg | Freq: Once | INTRAMUSCULAR | Status: DC | PRN

## 2023-08-17 MED ORDER — LACTATED RINGERS IV SOLN: INTRAVENOUS | Status: AC

## 2023-08-17 MED ORDER — ROCURONIUM BROMIDE 10 MG/ML (PF) SYRINGE
PREFILLED_SYRINGE | INTRAVENOUS | Status: AC
  Filled 2023-08-17: qty 10

## 2023-08-17 MED ORDER — LIDOCAINE HCL (PF) 2 % IJ SOLN
INTRAMUSCULAR | Status: AC
  Filled 2023-08-17: qty 5

## 2023-08-17 MED ORDER — KETOROLAC TROMETHAMINE 15 MG/ML IJ SOLN: 15.0000 mg | Freq: Once | INTRAMUSCULAR | Status: DC

## 2023-08-17 MED ORDER — EPHEDRINE SULFATE-NACL 50-0.9 MG/10ML-% IV SOSY
PREFILLED_SYRINGE | INTRAVENOUS | Status: DC | PRN
  Administered 2023-08-17: 5 mg via INTRAVENOUS
  Administered 2023-08-17: 10 mg via INTRAVENOUS

## 2023-08-17 MED ORDER — OXYCODONE HCL 5 MG PO TABS: 5.0000 mg | ORAL_TABLET | ORAL | 0 refills | Status: DC | PRN

## 2023-08-17 MED ORDER — BUPIVACAINE-EPINEPHRINE 0.5% -1:200000 IJ SOLN
INTRAMUSCULAR | Status: DC | PRN
  Administered 2023-08-17: 30 mL

## 2023-08-17 MED ORDER — FENTANYL CITRATE PF 50 MCG/ML IJ SOSY
PREFILLED_SYRINGE | INTRAMUSCULAR | Status: AC
  Filled 2023-08-17: qty 1

## 2023-08-17 MED ORDER — RINGERS IRRIGATION IR SOLN
Status: DC | PRN
  Administered 2023-08-17: 1

## 2023-08-17 MED ORDER — PROPOFOL 10 MG/ML IV BOLUS
INTRAVENOUS | Status: AC
  Filled 2023-08-17: qty 20

## 2023-08-17 MED ORDER — BUPIVACAINE-EPINEPHRINE (PF) 0.5% -1:200000 IJ SOLN
INTRAMUSCULAR | Status: AC
  Filled 2023-08-17: qty 30

## 2023-08-17 MED ORDER — OXYCODONE HCL 5 MG PO TABS: 5.0000 mg | ORAL_TABLET | Freq: Once | ORAL | Status: DC | PRN

## 2023-08-17 SURGICAL SUPPLY — 50 items
ANCHOR BONE REGENETEN (Anchor) IMPLANT
ANCHOR HEALICOIL REGEN 5.5 (Anchor) IMPLANT
ANCHOR JUGGERKNOT WTAP NDL 2.9 (Anchor) IMPLANT
ANCHOR QFIX 2.8 SUT MINI TAPE (Anchor) IMPLANT
ANCHOR TENDON REGENETEN (Staple) IMPLANT
BIT DRILL JUGRKNT W/NDL BIT2.9 (DRILL) IMPLANT
BLADE FULL RADIUS 3.5 (BLADE) ×1 IMPLANT
BUR ACROMIONIZER 4.0 (BURR) ×1 IMPLANT
CHLORAPREP W/TINT 26 (MISCELLANEOUS) ×1 IMPLANT
COVER MAYO STAND STRL (DRAPES) ×1 IMPLANT
DILATOR 5.5 THREADED HEALICOIL (MISCELLANEOUS) IMPLANT
DRILL JUGGERKNOT W/NDL BIT 2.9 (DRILL) ×1
ELECT CAUTERY BLADE 6.4 (BLADE) ×1 IMPLANT
ELECT REM PT RETURN 9FT ADLT (ELECTROSURGICAL) ×1
ELECTRODE REM PT RTRN 9FT ADLT (ELECTROSURGICAL) ×1 IMPLANT
GAUZE SPONGE 4X4 12PLY STRL (GAUZE/BANDAGES/DRESSINGS) ×1 IMPLANT
GAUZE XEROFORM 1X8 LF (GAUZE/BANDAGES/DRESSINGS) ×1 IMPLANT
GLOVE BIO SURGEON STRL SZ7.5 (GLOVE) ×2 IMPLANT
GLOVE BIO SURGEON STRL SZ8 (GLOVE) ×2 IMPLANT
GLOVE BIOGEL PI IND STRL 8 (GLOVE) ×1 IMPLANT
GLOVE INDICATOR 8.0 STRL GRN (GLOVE) ×1 IMPLANT
GOWN STRL REUS W/ TWL LRG LVL3 (GOWN DISPOSABLE) ×1 IMPLANT
GOWN STRL REUS W/ TWL XL LVL3 (GOWN DISPOSABLE) ×1 IMPLANT
GRASPER SUT 15 45D LOW PRO (SUTURE) IMPLANT
IMPL REGENETEN MEDIUM (Shoulder) IMPLANT
IMPLANT REGENETEN MEDIUM (Shoulder) ×1 IMPLANT
IV LR IRRIG 3000ML ARTHROMATIC (IV SOLUTION) ×2 IMPLANT
KIT CANNULA 8X76-LX IN CANNULA (CANNULA) ×1 IMPLANT
KIT SUTURE 2.8 Q-FIX DISP (MISCELLANEOUS) IMPLANT
MANIFOLD NEPTUNE II (INSTRUMENTS) ×1 IMPLANT
MASK FACE SPIDER DISP (MASK) ×1 IMPLANT
MAT ABSORB FLUID 56X50 GRAY (MISCELLANEOUS) ×1 IMPLANT
PACK ARTHROSCOPY SHOULDER (MISCELLANEOUS) ×1 IMPLANT
PAD ABD DERMACEA PRESS 5X9 (GAUZE/BANDAGES/DRESSINGS) ×2 IMPLANT
PASSER SUT FIRSTPASS SELF (INSTRUMENTS) IMPLANT
SLING ARM LRG DEEP (SOFTGOODS) ×1 IMPLANT
SLING ULTRA II LG (MISCELLANEOUS) ×1 IMPLANT
SPONGE T-LAP 18X18 ~~LOC~~+RFID (SPONGE) ×1 IMPLANT
STAPLER SKIN PROX 35W (STAPLE) ×1 IMPLANT
STRAP SAFETY 5IN WIDE (MISCELLANEOUS) ×1 IMPLANT
SUT ETHIBOND 0 MO6 C/R (SUTURE) ×1 IMPLANT
SUT ULTRABRAID #2 38 (SUTURE) IMPLANT
SUT ULTRABRAID 2 COBRAID 38 (SUTURE) IMPLANT
SUT VIC AB 2-0 CT1 TAPERPNT 27 (SUTURE) ×2 IMPLANT
TAPE MICROFOAM 4IN (TAPE) ×1 IMPLANT
TRAP FLUID SMOKE EVACUATOR (MISCELLANEOUS) ×1 IMPLANT
TUBE SET DOUBLEFLO INFLOW (TUBING) ×1 IMPLANT
TUBING CONNECTING 10 (TUBING) ×1 IMPLANT
WAND WEREWOLF FLOW 90D (MISCELLANEOUS) ×1 IMPLANT
WATER STERILE IRR 500ML POUR (IV SOLUTION) ×1 IMPLANT

## 2023-08-17 NOTE — Op Note (Signed)
08/17/2023  12:25 PM  Patient:   Alexa Knight  Pre-Op Diagnosis:   Impingement/tendinopathy with rotator cuff tear and biceps tendinopathy, left shoulder.  Post-Op Diagnosis:   Impingement/tendinopathy with full-thickness rotator cuff tear degenerative joint disease, degenerative labral fraying, and biceps tendinopathy, left shoulder.  Procedure:   Extensive arthroscopic debridement, arthroscopic subacromial decompression, mini-open rotator cuff repair augmented with a Regeneten patch, and mini-open biceps tenodesis, left shoulder.  Anesthesia:   General endotracheal with interscalene block using Exparel placed preoperatively by the anesthesiologist.  Surgeon:   Maryagnes Amos, MD  Assistant:   Horris Latino, PA-C; Juanetta Snow, PA-S  Findings:   As above. There was a large full-thickness tear involving the entire supraspinatus tendon and the anterior portion of the infraspinatus tendon. The subscapularis tendon and teres minor tendons both were in satisfactory condition. There was extensive "lip sticking" along the biceps tendon without partial or full-thickness tearing. There was moderate degenerative labral fraying involving the anterior, superior, posterior superior portions of the labrum without frank detachment from the glenoid rim. There were grade 3 chondromalacial changes involving the anterosuperior humeral head, and grade 1 chondromalacial changes involving the central glenoid.  Complications:   None  Fluids:   450 cc  Estimated blood loss:   20 cc  Tourniquet time:   None  Drains:   None  Closure:   Staples      Brief clinical note:   The patient is a 68 year old female with a history of progressively worsening left shoulder pain. The patient's symptoms have progressed despite medications, activity modification, etc. The patient's history and examination are consistent with impingement/tendinopathy with a rotator cuff tear. These findings were confirmed by MRI scan. The  patient presents at this time for definitive management of these shoulder symptoms.  Procedure:   The patient underwent placement of an interscalene block using Exparel by the anesthesiologist in the preoperative holding area before being brought into the operating room and lain in the supine position. The patient then underwent general endotracheal intubation and anesthesia before being repositioned in the beach chair position using the beach chair positioner. The left shoulder and upper extremity were prepped with ChloraPrep solution before being draped sterilely. Preoperative antibiotics were administered. A timeout was performed to confirm the proper surgical site before the expected portal sites and incision site were injected with 0.5% Sensorcaine with epinephrine.   A posterior portal was created and the glenohumeral joint thoroughly inspected with the findings as described above. An anterior portal was created using an outside-in technique. The labrum and rotator cuff were further probed, again confirming the above-noted findings. The areas of labral fraying were debrided back to stable margins using the full-radius resector. The full-radius dissector also was used to debride the torn margin of the rotator cuff as well as to debride the areas of grade 3 articular cartilage injury involving the humeral head. Finally, it was used to debride areas of synovitis anteriorly and superiorly. The ArthroCare wand was inserted and used to release the biceps tendon from his labral anchor.  It also was used to obtain hemostasis as well as to "anneal" the labrum superiorly and anteriorly. The instruments were removed from the joint after suctioning the excess fluid.  The camera was repositioned through the posterior portal into the subacromial space. A separate lateral portal was created using an outside-in technique. The 3.5 mm full-radius resector was introduced and used to perform a subtotal bursectomy. The  ArthroCare wand was then inserted and used to remove  the periosteal tissue off the undersurface of the anterior third of the acromion as well as to recess the coracoacromial ligament from its attachment along the anterior and lateral margins of the acromion. The 4.0 mm acromionizing bur was introduced and used to complete the decompression by removing the undersurface of the anterior third of the acromion. The full radius resector was reintroduced to remove any residual bony debris before the ArthroCare wand was reintroduced to obtain hemostasis. The instruments were then removed from the subacromial space after suctioning the excess fluid.  An approximately 4-5 cm incision was made over the anterolateral aspect of the shoulder beginning at the anterolateral corner of the acromion and extending distally in line with the bicipital groove. This incision was carried down through the subcutaneous tissues to expose the deltoid fascia. The raphae between the anterior and middle thirds was identified and this plane developed to provide access into the subacromial space. Additional bursal tissues were debrided sharply using Metzenbaum scissors. The rotator cuff tear was readily identified.   The bicipital groove was identified by palpation and opened for 1-1.5 cm. The biceps tendon stump was retrieved through this defect. The floor of the bicipital groove was roughened with a curet before a Biomet 2.9 mm JuggerKnot anchor was inserted. Both sets of sutures were passed through the biceps tendon and tied securely to effect the tenodesis. The bicipital sheath was reapproximated using two #0 Ethibond interrupted sutures, incorporating the biceps tendon to further reinforce the tenodesis.  The margins of the rotator cuff tear were debrided sharply with a #15 blade and the exposed greater tuberosity roughened with a rongeur. The tear was repaired using two Smith & Nephew 2.8 mm Q-Fix anchors. These sutures were then brought  back laterally and secured using two Smith & Nephew Healicoil knotless RegeneSorb anchors to create a two-layer closure. An apparent watertight closure was obtained.  Given the size of the tear, the quality of the tissue, and the patient's age, it was felt best to reinforce this repair using a Smith [employer] Nephew Regeneten patch. Therefore, a medium sized patch was selected and applied over the repair site to try to optimize healing. The bony and soft tissue staples were used to secure the patch over the repair site. This additional procedure added a level of complexity to the case as well as an additional 20 to 25 minutes of surgical time.  The wound was copiously irrigated with sterile saline solution before the deltoid raphae was reapproximated using 2-0 Vicryl interrupted sutures. The subcutaneous tissues were closed in two layers using 2-0 Vicryl interrupted sutures before the skin was closed using staples. The portal sites also were closed using staples. A sterile bulky dressing was applied to the shoulder before the arm was placed into a shoulder immobilizer. The patient was then awakened, extubated, and returned to the recovery room in satisfactory condition after tolerating the procedure well.

## 2023-08-17 NOTE — Discharge Instructions (Addendum)
Orthopedic discharge instructions: Keep dressing dry and intact.  May sponge bathe after dressing changed on post-op day #4 (Saturday).  Cover staples with Band-Aids after drying off. Apply ice frequently to shoulder. Take Aleve 2 tabs BID OR ibuprofen 600-800 mg TID with meals for 3-5 days, then as necessary. Take pain medication as prescribed when needed.  May supplement with ES Tylenol if necessary. Keep shoulder immobilizer on at all times except may remove for bathing purposes. Follow-up in 10-14 days or as scheduled.

## 2023-08-17 NOTE — Anesthesia Procedure Notes (Signed)
Anesthesia Regional Block: Interscalene brachial plexus block   Pre-Anesthetic Checklist: , timeout performed,  Correct Patient, Correct Site, Correct Laterality,  Correct Procedure,, risks and benefits discussed,  Surgical consent,  Pre-op evaluation,  At surgeon's request and post-op pain management  Laterality: Left  Prep: chloraprep       Needles:  Injection technique: Single-shot  Needle Type: Echogenic Needle          Additional Needles:   Procedures:,,,, ultrasound used (permanent image in chart),,   Motor weakness within 20 minutes.  Narrative:  Start time: 08/17/2023 9:56 AM End time: 08/17/2023 9:59 AM Injection made incrementally with aspirations every 5 mL.  Performed by: Personally  Anesthesiologist: Reed Breech, MD  Additional Notes: Functioning IV was confirmed and monitors applied.  Sterile prep and drape, hand hygiene and sterile gloves were used. Ultrasound guidance: relevant anatomy identified, needle position confirmed, local anesthetic spread visualized around nerve(s), vascular puncture avoided.  Image saved to electronic medical record.  Negative aspiration prior to incremental administration of local anesthetic for total 20 ml Exparel and 10 ml bupivacaine 0.5% given in interscalene distribution. The patient tolerated the procedure well. Vital signs and moderate sedation medications recorded in RN notes.

## 2023-08-17 NOTE — H&P (Signed)
History of Present Illness:  Alexa Knight is a 68 y.o. female who presents today for her surgical history and physical. The patient is scheduled for a left shoulder arthroscopy with debridement, decompression, rotator cuff repair and biceps tenodesis with Dr. Joice Lofts on 08/17/2023. The patient denies any changes in her medical history since she was last evaluated. She reports a pain score 3 out of 10 at today's visit. She is not taking any medication consistently for the left shoulder. She is not applying any ice or heat at this time. No recent falls or trauma. She denies any numbness or ting into the left upper extremity at today's visit. The patient denies any personal history of heart attack, stroke, asthma or COPD. No personal history of blood clots.  Shoulder Surgical History:  The patient has had no shoulder surgery in the past.  PMH/PSH/Family History/Social History/Meds/Allergies:  I have reviewed past medical, surgical, social and family history, medications and allergies as documented in the EMR.  Current Outpatient Medications:  anastrozole (ARIMIDEX) 1 mg tablet Take 1 mg by mouth once daily  calcium carbonate (CALCIUM 600 ORAL) Take 1,200 mg by mouth once daily  celecoxib (CELEBREX) 200 MG capsule Take 1 capsule (200 mg total) by mouth 2 (two) times daily (Patient not taking: Reported on 07/12/2023) 30 capsule 0  levothyroxine (SYNTHROID) 100 MCG tablet Take 1 tablet (100 mcg total) by mouth once daily Take on an empty stomach with a glass of water at least 30-60 minutes before breakfast. 90 tablet 3  MULTIVITAMIN ORAL Take by mouth once daily  naproxen sodium (ALEVE) 220 MG tablet Take 220 mg by mouth  omeprazole (PRILOSEC) 20 MG DR capsule Take 20 mg by mouth once daily   Allergies: No Known Allergies  Past Medical History:  Breast cancer (CMS/HHS-HCC)  Hashimoto's thyroiditis   Past Surgical History:  BREAST SURGERY (partial mastectomy with lumpectomy - 2019)   Family  History:  Breast cancer Mother  Myocardial Infarction (Heart attack) Father  Stroke Brother  Rheum arthritis Paternal Grandmother   Social History:   Socioeconomic History:  Marital status: Married  Occupational History  Occupation: Retired  Tobacco Use  Smoking status: Former  Smokeless tobacco: Never  Advertising account planner  Vaping status: Never Used  Substance and Sexual Activity  Alcohol use: Yes  Drug use: Never  Sexual activity: Defer   Social Drivers of Health:   Physicist, medical Strain: Low Risk (03/17/2023)  Received from [facility]  Overall Financial Resource Strain (CARDIA)  Difficulty of Paying Living Expenses: Not hard at all  Food Insecurity: No Food Insecurity (03/17/2023)  Received from [facility]  Hunger Vital Sign  Worried About Running Out of Food in the Last Year: Never true  Ran Out of Food in the Last Year: Never true  Transportation Needs: No Transportation Needs (03/17/2023)  Received from [facility] - Transportation  Lack of Transportation (Medical): No  Lack of Transportation (Non-Medical): No   Review of Systems:  A comprehensive 14 point ROS was performed, reviewed, and the pertinent orthopaedic findings are documented in the HPI.  Physical Exam:  Vitals:  08/13/23 1526  BP: 112/70  Weight: 88 kg (194 lb)  Height: 165.1 cm (5\' 5" )  PainSc: 3  PainLoc: Shoulder   General/Constitutional: The patient appears to be well-nourished, well-developed, and in no acute distress. Neuro/Psych: Normal mood and affect, oriented to person, place and time. Eyes: Non-icteric. Pupils are equal, round, and reactive to light, and exhibit synchronous movement. ENT:  Unremarkable. Lymphatic: No palpable adenopathy. Respiratory: Lungs clear to auscultation, Normal chest excursion, No wheezes, and Non-labored breathing Cardiovascular: Regular rate and rhythm. No murmurs. and No edema, swelling or tenderness, except as noted in detailed exam. Integumentary:  No impressive skin lesions present, except as noted in detailed exam. Musculoskeletal: Unremarkable, except as noted in detailed exam.  Left shoulder exam: SKIN: normal SWELLING: none WARMTH: none LYMPH NODES: no adenopathy palpable CREPITUS: none TENDERNESS: Non-tender ROM (active):  Forward flexion: 80 degrees Abduction: 110 degrees Internal rotation: Left PSIS ROM (passive):  Forward flexion: 110 degrees Abduction: 125 degrees ER/IR at 90 abd: 90 degrees / 60 degrees  She experiences moderate pain with attempted forward flexion and abduction, and mild pain at the extremes of all other motions.  STRENGTH: Forward flexion: 3/5 Abduction: 3/5 External rotation: 4/5 Internal rotation: 4+/5 Pain with RC testing: Yes  STABILITY: Normal  SPECIAL TESTS: Juanetta Gosling' test: positive, moderate Speed's test: positive Capsulitis - pain w/ passive ER: no Crossed arm test: Mildly positive Crank: Not evaluated Anterior apprehension: Negative Posterior apprehension: Not evaluated  Imaging:  Shoulder X-Ray Imaging: Recent AP, true AP, and Y-scapular views of the left shoulder are available for review and have been reviewed by myself. These films demonstrate no evidence for fractures, lytic lesions, or significant degenerative changes. The subacromial space is mildly decreased. There is no subacromial or infra-clavicular spurring. She demonstrates a Type II acromion.  Left Shoulder Imaging, MRI: MRI Shoulder Cartilage: No cartilage abnormality. MRI Shoulder Rotator Cuff: Full thickness tear of the supraspinatus. Retracted to the humeral head. There also is evidence of partial-thickness tears involving the superior portion of the subscapularis tendon and the anterior portion of the infraspinatus tendon. MRI Shoulder Labrum / Biceps: Biceps tendinopathy. MRI Shoulder Bone: Normal bone.  Assessment:  1. Rotator cuff tendinitis, left.  2. Tendinitis of upper biceps tendon of left shoulder.   3. Traumatic complete tear of left rotator cuff.   Plan:  1. Treatment options were discussed today with the patient. 2. The patient is scheduled for a left arthroscopy with debridement, decompression, mini open rotator cuff repair and biceps tenodesis with Dr. Joice Lofts on 08/17/2023. 3. The risk and benefits of surgery were discussed in detail with the patient today's appointment. The patient would still like to proceed with surgery at this time. 4. This document will serve as a surgical history and physical for the patient. 5. The patient will follow-up per standard postop protocol. She can contact the clinic if she has any questions, new symptoms develop or symptoms worsen.  The procedure was discussed with the patient, as were the potential risks (including bleeding, infection, nerve and/or blood vessel injury, persistent or recurrent pain, failure of the repair, progression of arthritis, need for further surgery, blood clots, strokes, heart attacks and/or arhythmias, pneumonia, etc.) and benefits. The patient states her understanding and wishes to proceed.    H&P reviewed and patient re-examined. No changes.

## 2023-08-17 NOTE — Transfer of Care (Signed)
Immediate Anesthesia Transfer of Care Note  Patient: Alexa Knight  Procedure(s) Performed: SHOULDER ARTHROSCOPY WITH DEBRIDEMENT, DECOMPRESSION, ROTATOR CUFF REPAIR AND BICEPS TENODESIS. (Left: Shoulder)  Patient Location: PACU  Anesthesia Type:General and Regional  Level of Consciousness: awake, alert , oriented, and patient cooperative  Airway & Oxygen Therapy: Patient Spontanous Breathing and Patient connected to face mask oxygen  Post-op Assessment: Report given to RN and Post -op Vital signs reviewed and stable  Post vital signs: Reviewed and stable  Last Vitals:  Vitals Value Taken Time  BP 104/54 08/17/23 1300  Temp    Pulse 65 08/17/23 1302  Resp 19 08/17/23 1302  SpO2 92 % 08/17/23 1302  Vitals shown include unfiled device data.  Last Pain:  Vitals:   08/17/23 0813  TempSrc: Temporal         Complications: No notable events documented.

## 2023-08-17 NOTE — Anesthesia Procedure Notes (Signed)
Procedure Name: Intubation Date/Time: 08/17/2023 10:45 AM  Performed by: Marcy Siren, RNPre-anesthesia Checklist: Patient identified, Emergency Drugs available, Suction available and Patient being monitored Patient Re-evaluated:Patient Re-evaluated prior to induction Oxygen Delivery Method: Circle system utilized Preoxygenation: Pre-oxygenation with 100% oxygen Induction Type: IV induction and Cricoid Pressure applied Ventilation: Mask ventilation without difficulty Laryngoscope Size: McGrath and 3 Grade View: Grade I Tube type: Oral Tube size: 7.0 mm Number of attempts: 1 Airway Equipment and Method: Stylet and Video-laryngoscopy Placement Confirmation: ETT inserted through vocal cords under direct vision, positive ETCO2 and breath sounds checked- equal and bilateral Tube secured with: Tape Dental Injury: Teeth and Oropharynx as per pre-operative assessment

## 2023-08-17 NOTE — Anesthesia Preprocedure Evaluation (Addendum)
Anesthesia Evaluation  Patient identified by MRN, date of birth, ID band Patient awake    Reviewed: Allergy & Precautions, NPO status , Patient's Chart, lab work & pertinent test results  History of Anesthesia Complications Negative for: history of anesthetic complications  Airway Mallampati: III   Neck ROM: Full    Dental  (+) Missing   Pulmonary former smoker (quit 2000)   Pulmonary exam normal breath sounds clear to auscultation       Cardiovascular Exercise Tolerance: Good Normal cardiovascular exam Rhythm:Regular Rate:Normal  ECG 08/11/23:  Normal sinus rhythm Low voltage QRS Nonspecific T wave abnormality   Neuro/Psych negative neurological ROS     GI/Hepatic ,GERD  ,,  Endo/Other  Obesity   Renal/GU negative Renal ROS     Musculoskeletal Raynaud's   Abdominal   Peds  Hematology Breast CA   Anesthesia Other Findings   Reproductive/Obstetrics                             Anesthesia Physical Anesthesia Plan  ASA: 2  Anesthesia Plan: General and Regional   Post-op Pain Management: Regional block*   Induction: Intravenous  PONV Risk Score and Plan: 3 and Ondansetron, Dexamethasone and Treatment may vary due to age or medical condition  Airway Management Planned: Oral ETT  Additional Equipment:   Intra-op Plan:   Post-operative Plan: Extubation in OR  Informed Consent: I have reviewed the patients History and Physical, chart, labs and discussed the procedure including the risks, benefits and alternatives for the proposed anesthesia with the patient or authorized representative who has indicated his/her understanding and acceptance.     Dental advisory given  Plan Discussed with: CRNA  Anesthesia Plan Comments: (Plan for preoperative interscalene nerve block and GETA. Patient consented for risks of anesthesia including but not limited to:  - adverse reactions to  medications - damage to eyes, teeth, lips or other oral mucosa - nerve damage due to positioning  - sore throat or hoarseness - damage to heart, brain, nerves, lungs, other parts of body or loss of life  Informed patient about role of CRNA in peri- and intra-operative care.  Patient voiced understanding.)        Anesthesia Quick Evaluation

## 2023-08-17 NOTE — Anesthesia Postprocedure Evaluation (Signed)
Anesthesia Post Note  Patient: Alexa Knight  Procedure(s) Performed: SHOULDER ARTHROSCOPY WITH DEBRIDEMENT, DECOMPRESSION, ROTATOR CUFF REPAIR AND BICEPS TENODESIS. (Left: Shoulder)  Patient location during evaluation: PACU Anesthesia Type: Regional Level of consciousness: awake and alert, oriented and patient cooperative Pain management: pain level controlled Vital Signs Assessment: post-procedure vital signs reviewed and stable Respiratory status: spontaneous breathing, nonlabored ventilation and respiratory function stable Cardiovascular status: blood pressure returned to baseline and stable Postop Assessment: adequate PO intake Anesthetic complications: no   No notable events documented.   Last Vitals:  Vitals:   08/17/23 1330 08/17/23 1345  BP: (!) 147/71 137/76  Pulse:  71  Resp:  18  Temp: 36.4 C 37.1 C  SpO2:  99%    Last Pain:  Vitals:   08/17/23 1345  TempSrc: Temporal                 Reed Breech

## 2023-08-18 ENCOUNTER — Encounter: Payer: Self-pay | Admitting: Surgery

## 2023-08-23 DIAGNOSIS — Z9889 Other specified postprocedural states: Secondary | ICD-10-CM | POA: Diagnosis not present

## 2023-08-23 DIAGNOSIS — M25612 Stiffness of left shoulder, not elsewhere classified: Secondary | ICD-10-CM | POA: Diagnosis not present

## 2023-08-23 DIAGNOSIS — M25512 Pain in left shoulder: Secondary | ICD-10-CM | POA: Diagnosis not present

## 2023-08-23 DIAGNOSIS — R531 Weakness: Secondary | ICD-10-CM | POA: Diagnosis not present

## 2023-08-30 DIAGNOSIS — M25512 Pain in left shoulder: Secondary | ICD-10-CM | POA: Diagnosis not present

## 2023-08-30 DIAGNOSIS — Z9889 Other specified postprocedural states: Secondary | ICD-10-CM | POA: Diagnosis not present

## 2023-09-06 DIAGNOSIS — M25512 Pain in left shoulder: Secondary | ICD-10-CM | POA: Diagnosis not present

## 2023-09-06 DIAGNOSIS — Z9889 Other specified postprocedural states: Secondary | ICD-10-CM | POA: Diagnosis not present

## 2023-09-13 DIAGNOSIS — Z9889 Other specified postprocedural states: Secondary | ICD-10-CM | POA: Diagnosis not present

## 2023-09-13 DIAGNOSIS — M25512 Pain in left shoulder: Secondary | ICD-10-CM | POA: Diagnosis not present

## 2023-09-13 DIAGNOSIS — R531 Weakness: Secondary | ICD-10-CM | POA: Diagnosis not present

## 2023-09-13 DIAGNOSIS — M25612 Stiffness of left shoulder, not elsewhere classified: Secondary | ICD-10-CM | POA: Diagnosis not present

## 2023-09-20 DIAGNOSIS — R531 Weakness: Secondary | ICD-10-CM | POA: Diagnosis not present

## 2023-09-20 DIAGNOSIS — M25612 Stiffness of left shoulder, not elsewhere classified: Secondary | ICD-10-CM | POA: Diagnosis not present

## 2023-09-20 DIAGNOSIS — M25512 Pain in left shoulder: Secondary | ICD-10-CM | POA: Diagnosis not present

## 2023-09-20 DIAGNOSIS — Z9889 Other specified postprocedural states: Secondary | ICD-10-CM | POA: Diagnosis not present

## 2023-09-20 DIAGNOSIS — K080 Exfoliation of teeth due to systemic causes: Secondary | ICD-10-CM | POA: Diagnosis not present

## 2023-09-24 ENCOUNTER — Other Ambulatory Visit: Payer: Self-pay | Admitting: Internal Medicine

## 2023-09-24 DIAGNOSIS — K219 Gastro-esophageal reflux disease without esophagitis: Secondary | ICD-10-CM

## 2023-09-27 DIAGNOSIS — M25512 Pain in left shoulder: Secondary | ICD-10-CM | POA: Diagnosis not present

## 2023-09-27 DIAGNOSIS — Z9889 Other specified postprocedural states: Secondary | ICD-10-CM | POA: Diagnosis not present

## 2023-09-27 NOTE — Telephone Encounter (Signed)
 Requested Prescriptions  Pending Prescriptions Disp Refills   omeprazole (PRILOSEC) 20 MG capsule [Pharmacy Med Name: Omeprazole 20 MG Oral Capsule Delayed Release] 90 capsule 0    Sig: Take 1 capsule by mouth once daily     Gastroenterology: Proton Pump Inhibitors Passed - 09/27/2023  1:38 PM      Passed - Valid encounter within last 12 months    Recent Outpatient Visits           11 months ago Annual physical exam   [place] [facility], Nyoka Cowden, MD   1 year ago Annual physical exam   [facility] at [facility], Nyoka Cowden, MD   2 years ago Hemorrhagic cystitis   [place] [facility] at [facility], Ocie Bob, MD   2 years ago Annual physical exam   [facility] at [facility], Nyoka Cowden, MD   3 years ago 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 in female, estrogen receptor positive [facility])   [place] [facility] at [facility], Nyoka Cowden, MD       Future Appointments             In 3 weeks Judithann Graves Nyoka Cowden, MD [facility] at [facility]

## 2023-10-04 DIAGNOSIS — Z9889 Other specified postprocedural states: Secondary | ICD-10-CM | POA: Diagnosis not present

## 2023-10-04 DIAGNOSIS — M25512 Pain in left shoulder: Secondary | ICD-10-CM | POA: Diagnosis not present

## 2023-10-07 DIAGNOSIS — Z9889 Other specified postprocedural states: Secondary | ICD-10-CM | POA: Diagnosis not present

## 2023-10-07 DIAGNOSIS — M25512 Pain in left shoulder: Secondary | ICD-10-CM | POA: Diagnosis not present

## 2023-10-11 DIAGNOSIS — Z9889 Other specified postprocedural states: Secondary | ICD-10-CM | POA: Diagnosis not present

## 2023-10-11 DIAGNOSIS — M25512 Pain in left shoulder: Secondary | ICD-10-CM | POA: Diagnosis not present

## 2023-10-14 ENCOUNTER — Encounter: Payer: 59 | Admitting: Internal Medicine

## 2023-10-15 DIAGNOSIS — R531 Weakness: Secondary | ICD-10-CM | POA: Diagnosis not present

## 2023-10-15 DIAGNOSIS — M25512 Pain in left shoulder: Secondary | ICD-10-CM | POA: Diagnosis not present

## 2023-10-15 DIAGNOSIS — M25612 Stiffness of left shoulder, not elsewhere classified: Secondary | ICD-10-CM | POA: Diagnosis not present

## 2023-10-15 DIAGNOSIS — Z9889 Other specified postprocedural states: Secondary | ICD-10-CM | POA: Diagnosis not present

## 2023-10-18 ENCOUNTER — Ambulatory Visit: Attending: Surgery | Admitting: Occupational Therapy

## 2023-10-18 ENCOUNTER — Encounter: Payer: Self-pay | Admitting: Internal Medicine

## 2023-10-18 ENCOUNTER — Ambulatory Visit (INDEPENDENT_AMBULATORY_CARE_PROVIDER_SITE_OTHER): Payer: 59 | Admitting: Internal Medicine

## 2023-10-18 ENCOUNTER — Encounter: Payer: Self-pay | Admitting: Occupational Therapy

## 2023-10-18 VITALS — BP 130/74 | HR 64 | Ht 65.5 in | Wt 204.1 lb

## 2023-10-18 DIAGNOSIS — E782 Mixed hyperlipidemia: Secondary | ICD-10-CM

## 2023-10-18 DIAGNOSIS — R531 Weakness: Secondary | ICD-10-CM | POA: Diagnosis not present

## 2023-10-18 DIAGNOSIS — Z170 Estrogen receptor positive status [ER+]: Secondary | ICD-10-CM

## 2023-10-18 DIAGNOSIS — M25612 Stiffness of left shoulder, not elsewhere classified: Secondary | ICD-10-CM | POA: Diagnosis not present

## 2023-10-18 DIAGNOSIS — M25632 Stiffness of left wrist, not elsewhere classified: Secondary | ICD-10-CM | POA: Insufficient documentation

## 2023-10-18 DIAGNOSIS — M6281 Muscle weakness (generalized): Secondary | ICD-10-CM | POA: Insufficient documentation

## 2023-10-18 DIAGNOSIS — R1313 Dysphagia, pharyngeal phase: Secondary | ICD-10-CM

## 2023-10-18 DIAGNOSIS — C50411 Malignant neoplasm of upper-outer quadrant of right female breast: Secondary | ICD-10-CM

## 2023-10-18 DIAGNOSIS — M25642 Stiffness of left hand, not elsewhere classified: Secondary | ICD-10-CM | POA: Insufficient documentation

## 2023-10-18 DIAGNOSIS — R600 Localized edema: Secondary | ICD-10-CM | POA: Insufficient documentation

## 2023-10-18 DIAGNOSIS — Z1231 Encounter for screening mammogram for malignant neoplasm of breast: Secondary | ICD-10-CM | POA: Diagnosis not present

## 2023-10-18 DIAGNOSIS — Z0000 Encounter for general adult medical examination without abnormal findings: Secondary | ICD-10-CM | POA: Diagnosis not present

## 2023-10-18 DIAGNOSIS — E042 Nontoxic multinodular goiter: Secondary | ICD-10-CM

## 2023-10-18 DIAGNOSIS — Z1211 Encounter for screening for malignant neoplasm of colon: Secondary | ICD-10-CM | POA: Diagnosis not present

## 2023-10-18 DIAGNOSIS — M25532 Pain in left wrist: Secondary | ICD-10-CM | POA: Insufficient documentation

## 2023-10-18 DIAGNOSIS — M25512 Pain in left shoulder: Secondary | ICD-10-CM | POA: Diagnosis not present

## 2023-10-18 DIAGNOSIS — Z9889 Other specified postprocedural states: Secondary | ICD-10-CM | POA: Diagnosis not present

## 2023-10-18 NOTE — Assessment & Plan Note (Signed)
 Followed by Endo Recent TSH 4.13

## 2023-10-18 NOTE — Therapy (Signed)
 OUTPATIENT OCCUPATIONAL THERAPY ORTHO EVALUATION  Patient Name: Alexa Knight MRN: [id number] DOB:January 20, 1956, 68 y.o., female Today's Date: 10/18/2023  PCP: Dr Alisa Graff PROVIDER: Dr Joice Lofts  END OF SESSION:  OT End of Session - 10/18/23 1329     Visit Number 1    Number of Visits 8    Date for OT Re-Evaluation 11/29/23    OT Start Time 1125    OT Stop Time 1215    OT Time Calculation (min) 50 min    Activity Tolerance Patient tolerated treatment well    Behavior During Therapy [facility] for tasks assessed/performed             Past Medical History:  Diagnosis Date   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, estrogen receptor positive (HCC) 2019   a.) stage I RIGHT breast cancer (T1c N1 M0, ER/PR+ HER2/neu -); s/p systemic chemotherapy + XRT + endocrine therapy   Hashimoto's thyroiditis    Hypothyroidism    Long term current use of aromatase inhibitor    a.) anastrozole   Mixed hyperlipidemia    Multinodular goiter    Osteopenia    Personal history of chemotherapy 2019   Rt breast   Personal history of radiation therapy 2019   Rt breast   Raynaud's syndrome    Rheumatoid factor positive    Past Surgical History:  Procedure Laterality Date   BREAST BIOPSY Left    stereo-neg   BREAST BIOPSY Right 2019   BREAST LUMPECTOMY Right 2019   invasive ductal carcinoma   COLONOSCOPY     SHOULDER ARTHROSCOPY WITH SUBACROMIAL DECOMPRESSION, ROTATOR CUFF REPAIR AND BICEP TENDON REPAIR Left 08/17/2023   Procedure: SHOULDER ARTHROSCOPY WITH DEBRIDEMENT, DECOMPRESSION, ROTATOR CUFF REPAIR AND BICEPS TENODESIS.;  Surgeon: Christena Flake, MD;  Location: [facility] ORS;  Service: Orthopedics;  Laterality: Left;   Patient Active Problem List   Diagnosis Date Noted   Osteopenia 06/02/2023   Genetic testing 07/17/2022   ANA positive 10/30/2020   Rheumatoid factor positive 10/30/2020   Mixed hyperlipidemia 10/05/2020   Raynaud's syndrome 10/04/2020   Multinodular goiter  06/12/2020   Pharyngeal dysphagia 06/12/2020   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  I cannot make a fist and I cannot use my hand. Pt accompanied by: self  PERTINENT HISTORY: [facility]gi visit note 10/01/23 Skin inspection of the left hand is notable for mild swelling, but otherwise is unremarkable. No erythema, ecchymosis, abrasions, or other skin abnormalities are identified. She has mild tenderness palpation along the ulnar aspect of her wrist, but there are no other areas of tenderness around the wrist or hand. She is able to actively flex extend all digits without any pain or triggering, but is unable to fully flex her thumb, index, and long fingers. Passively, each of the index and long MCP joints can be flexed to 90 degrees, while each of the index and long PIP joints can be flexed passively to 100 degrees. She is  grossly neurovascular intact to all digits, other than some subjectively decreased sensation light touch to the thumb greater than index finger tips.  Assessment: Encounter Diagnoses  Name Primary?  Nontraumatic complete tear of left rotator cuff Yes  Rotator cuff tendinitis, left  Tendinitis of upper biceps tendon of left shoulder   Plan: The treatment options were discussed with the patient and her husband. In addition, patient educational materials were provided regarding the diagnosis and treatment  options. Regarding her left shoulder, the patient is pleased with her symptomatic and functional improvement at this time. I have recommended that she continue with physical therapy and home exercises in order to optimize her range of motion, strength, and overall function.  Regarding her left hand/wrist symptoms, most likely the symptoms are due either to the position of her arm in her sling postoperatively or perhaps her nerve being irritated by the nerve block placed preoperative by the anesthesiologist. Regardless, the patient is advised that these symptoms most likely will resolve over time. Meanwhile, the patient will be sent to occupational therapy to work on range of motion and strength exercises to the hand in order to optimize her likelihood of regaining full function. She may progress in her activities as symptoms remain, but is to avoid offending activities. He may take over-the-counter medications as needed for discomfort. All of the patient's questions and concerns were   PRECAUTIONS: No weightbearing post shoulder surgery    WEIGHT BEARING RESTRICTIONS: Nonweightbearing left upper extremity  PAIN:  Are you having pain? 5/10 volar wrist left  FALLS: Has patient fallen in last 6 months? Yes. Number of falls 1  LIVING ENVIRONMENT: Lives with: lives with their spouse   PLOF: Patient is retired but very active playing the [employer], working the yard, gardening, Archivist and crocheting  PATIENT GOALS: I want to be able to use my hand.  Make a fist get the numbness and swelling better.  NEXT MD VISIT: Next month  OBJECTIVE:  Note: Objective measures were completed at Evaluation unless otherwise noted.  HAND DOMINANCE: Right    FUNCTIONAL OUTCOME MEASURES: To be assessed next session  UPPER EXTREMITY ROM:     Active ROM Right eval Left eval  Shoulder flexion    Shoulder abduction    Shoulder adduction    Shoulder extension    Shoulder internal rotation    Shoulder  external rotation    Elbow flexion    Elbow extension    Wrist flexion  70 volar wrist pain  Wrist extension  65 dorsal wrist pain  Wrist ulnar deviation  30 volar wrist pain  Wrist radial deviation  20 volar wrist pain  Wrist pronation    Wrist supination    (Blank rows = not tested)  Active ROM Right eval Left eval  Thumb MCP (0-60)    Thumb IP (0-80)    Thumb Radial abd/add (0-55)  50   Thumb Palmar abd/add (0-45)   50  Thumb Opposition to Small Finger  Sharp volar wrist pain with opposition to fifth  Index MCP (0-90)  70   Index PIP (0-100)   90  Index DIP (0-70)      Long MCP (0-90)    70  Long PIP (0-100)    85  Long DIP (0-70)      Ring MCP (0-90)    50  Ring PIP (0-100)    90  Ring DIP (0-70)      Little MCP (0-90)    40  Little  PIP (0-100)    90  Little DIP (0-70)      (Blank rows = not tested)    HAND FUNCTION: Grip strength: Right:   lbs; Left:   lbs, Lateral pinch: Right:   lbs, Left:   lbs, and 3 point pinch: Right:   lbs, Left:   lbs  COORDINATION: Decreased because of increased edema and stiffness and pain as well as numbness  SENSATION: Patient report numbness in most of the thumb.  In the tips of second and third digits  EDEMA: Increase edema in the volar wrist as well as in the hand on the left  COGNITION: Overall cognitive status: Within functional limits for tasks assessed       TREATMENT DATE: 10/18/23                                                                                                                        Modalities: Contrast bath:  Time: 8 Location: Left wrist and hand Prior to review of patient's home program.  To decrease edema and increase motion and decrease pain. Patient to perform contrast 3 times a day prior to home exercises  Metacarpal sprites as well as carpal sprites done and husband to assist patient to 3 times a day after contrast Followed by active assisted range of motion for wrist flexion extension as  well as radial ulnar deviation palms together 10 reps  Followed by active range of motion tendon glides with composite fist 2 to 3 cm object.  Pain-free all. Patient to focus on motion more than strength For digits focus on active range of motion pain-free instead of passive range of motion. Gradually over the next few days pain-free work on composite active range of motion to palm 1012 reps 3 times a day Active range of motion for thumb palmar radial abduction 10 reps Opposition picking up 1 cm object alternating digits pain-free Can progress in a few days to touching types focusing on oval pain-free alternating digits.       PATIENT EDUCATION: Education details: findings of eval and HEP  Person educated: Patient Education method: Explanation, Demonstration, Tactile cues, Verbal cues, and Handouts Education comprehension: verbalized understanding, returned demonstration, verbal cues required, and needs further education  GOALS: Goals reviewed with patient? Yes  LONG TERM GOALS: Target date: 8 wks   Patient to be independent home program to increase digit flexion to touching palm without increase symptoms Baseline: Patient present with increased edema in hand and volar wrist with MC flexion 40 to 70 degrees and PIP 85-90 Goal status: INITIAL  2.  Left wrist active range of motion increased to within normal limits symptom-free for patient to use left hand in bathing and dressing Baseline: Left wrist extension 65 and flexion 70 degrees with increased pain in volar and dorsal forearm.  Radial ulnar deviation active range of motion within normal limits but increased pain over volar wrist Goal status: INITIAL  3.  Thumb active range of motion all  planes improved to within normal limits for patient to do buttons, open containers and use utensils and brush. Baseline: Thumb palmar radial abduction 50 degrees but increased tightness with pull.  Opposition to DIP of fifth with increased pain  over volar wrist sharp pain Goal status: INITIAL  4.  Left grip and prehension strength improved to within functional limits for her age for patient to use hand and more than 50% of ADLs without increase symptoms Baseline: Patient with a increase edema and pain and decreased flexion of digits-grip and prehension not assessed patient 2 weeks out of sling.  Per patient PT initiating shoulder strength next week. Goal status: INITIAL  5.  L wrist and digits AROM increased to within normal limits to perform 75% of ADLs symptom-free Baseline: Decreased wrist and digit active range of motion, increased edema and pain using in ADLs less than 25% Goal status: INITIAL  ASSESSMENT:  CLINICAL IMPRESSION: Patient seen today for occupational therapy evaluation for left hand edema, numbness, pain and weakness.  Patient had left shoulder surgery with a nerve block on 08/17/2023.  Per patient immobilized until about 2 weeks ago.  Patient with increased edema mostly over volar wrist, carpal tunnel and hand.  Patient present with decreased flexion of digits with increased pain.  Patient with decreased wrist flexion extension with increased pain at proximal volar and dorsal forearm.  Patient report numbness in thumb and DIP of second and third.  Patient limited in functional use of left hand in ADLs and IADLs.  Patient can benefit from skilled OT services to decrease edema and pain and increase motion.  Patient to keep pain under 2/10 focusing on decreasing edema and pain and motion.  Will address strength after these factors.  Patient can benefit from skilled OT services to increase use of left hand in ADLs and IADLs to return to prior level of function.  PERFORMANCE DEFICITS: in functional skills including ADLs, IADLs, ROM, strength, pain, flexibility, decreased knowledge of use of DME, and UE functional use,   and psychosocial skills including environmental adaptation and routines and behaviors.   IMPAIRMENTS: are  limiting patient from ADLs, IADLs, rest and sleep, play, leisure, and social participation.   COMORBIDITIES: has no other co-morbidities that affects occupational performance. Patient will benefit from skilled OT to address above impairments and improve overall function.  MODIFICATION OR ASSISTANCE TO COMPLETE EVALUATION: No modification of tasks or assist necessary to complete an evaluation.  OT OCCUPATIONAL PROFILE AND HISTORY: Problem focused assessment: Including review of records relating to presenting problem.  CLINICAL DECISION MAKING: LOW - limited treatment options, no task modification necessary  REHAB POTENTIAL: Good for goals  EVALUATION COMPLEXITY: Low   PLAN:  OT FREQUENCY: 1-2x/week  OT DURATION: 6 wks   PLANNED INTERVENTIONS: 97168 OT Re-evaluation, 97535 self care/ADL training, 52841 therapeutic exercise, 97530 therapeutic activity, 97112 neuromuscular re-education, 97140 manual therapy, 97018 paraffin, 32440 fluidotherapy, 97034 contrast bath, 97760 Splinting (initial encounter), passive range of motion, patient/family education, and DME and/or AE instructions  CONSULTED AND AGREED WITH PLAN OF CARE: Patient   Oletta Cohn, OTR/L,CLT 10/18/2023, 1:31 PM

## 2023-10-18 NOTE — Assessment & Plan Note (Signed)
 Managed with diet and exercise. Lipid panel deferred

## 2023-10-18 NOTE — Assessment & Plan Note (Signed)
 Reflux symptoms are controlled on omeprazole. Patient denies red flag symptoms - no melena, weight loss, dysphagia. Recent CBC normal.

## 2023-10-18 NOTE — Progress Notes (Signed)
 Date:  10/18/2023   Name:  Alexa Knight   DOB:  09/25/1955   MRN:  [id number]   Chief Complaint: Annual Exam Alexa Knight is a 68 y.o. female who presents today for her Complete Annual Exam. She feels well. She reports exercising none, does PT for shoulder. She reports she is sleeping well. Breast complaints none. She is still dealing with left shoulder surgery issues; doing PT and now referred to OT for hand pain, stiffness and numbness.  Health Maintenance  Topic Date Due   Colon Cancer Screening  Never done   DTaP/Tdap/Td vaccine (1 - Tdap) 03/20/2023   Stool Blood Test  10/15/2023   Flu Shot  10/25/2023*   COVID-19 Vaccine (3 - Pfizer risk series) 11/03/2023*   Mammogram  10/26/2023   Medicare Annual Wellness Visit  03/16/2024   Pneumonia Vaccine  Completed   DEXA scan (bone density measurement)  Completed   Hepatitis C Screening  Completed   Zoster (Shingles) Vaccine  Completed   HPV Vaccine  Aged Out  *Topic was postponed. The date shown is not the original due date.    Thyroid Problem Presents for follow-up visit. Patient reports no anxiety, constipation, diarrhea, fatigue, palpitations or tremors. The symptoms have been stable.  Gastroesophageal Reflux She complains of dysphagia. She reports no abdominal pain, no chest pain, no coughing, no heartburn or no wheezing. The problem occurs frequently. Pertinent negatives include no fatigue. She has tried a PPI for the symptoms.    Review of Systems  Constitutional:  Negative for chills, fatigue and fever.  HENT:  Negative for congestion, hearing loss, tinnitus, trouble swallowing and voice change.   Eyes:  Negative for visual disturbance.  Respiratory:  Negative for cough, chest tightness, shortness of breath and wheezing.   Cardiovascular:  Negative for chest pain, palpitations and leg swelling.  Gastrointestinal:  Positive for dysphagia. Negative for abdominal pain, constipation, diarrhea, heartburn and vomiting.   Endocrine: Negative for polydipsia and polyuria.  Genitourinary:  Negative for dysuria, frequency, genital sores, vaginal bleeding and vaginal discharge.  Musculoskeletal:  Positive for arthralgias. Negative for gait problem and joint swelling. Back pain: left shoulder. Skin:  Negative for color change and rash.  Neurological:  Positive for numbness (in fingers of left hand since shoulder surgery). Negative for dizziness, tremors, light-headedness and headaches.  Hematological:  Negative for adenopathy. Does not bruise/bleed easily.  Psychiatric/Behavioral:  Negative for dysphoric mood and sleep disturbance. The patient is not nervous/anxious.      Lab Results  Component Value Date   NA 136 08/11/2023   K 3.7 08/11/2023   CO2 26 08/11/2023   GLUCOSE 101 (H) 08/11/2023   BUN 12 08/11/2023   CREATININE 0.65 08/11/2023   CALCIUM 9.4 08/11/2023   EGFR 98 10/14/2022   GFRNONAA >60 08/11/2023   Lab Results  Component Value Date   CHOL 175 10/14/2022   HDL 41 10/14/2022   LDLCALC 109 (H) 10/14/2022   TRIG 139 10/14/2022   CHOLHDL 4.3 10/14/2022   Lab Results  Component Value Date   TSH 2.890 10/08/2021   No results found for: "HGBA1C" Lab Results  Component Value Date   WBC 5.4 08/11/2023   HGB 12.9 08/11/2023   HCT 39.1 08/11/2023   MCV 90.5 08/11/2023   PLT 272 08/11/2023   Lab Results  Component Value Date   ALT 18 10/14/2022   AST 22 10/14/2022   ALKPHOS 91 10/14/2022   BILITOT 0.7 10/14/2022   No results found  for: "25OHVITD2", "25OHVITD3", "VD25OH"   Patient Active Problem List   Diagnosis Date Noted   Osteopenia 06/02/2023   Genetic testing 07/17/2022   ANA positive 10/30/2020   Rheumatoid factor positive 10/30/2020   Mixed hyperlipidemia 10/05/2020   Raynaud's syndrome 10/04/2020   Multinodular goiter 06/12/2020   Pharyngeal dysphagia 06/12/2020   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 in female, estrogen receptor positive (HCC) 07/28/2018     No Known Allergies  Past Surgical History:  Procedure Laterality Date   BREAST BIOPSY Left    stereo-neg   BREAST BIOPSY Right 2019   BREAST LUMPECTOMY Right 2019   invasive ductal carcinoma   COLONOSCOPY     SHOULDER ARTHROSCOPY WITH SUBACROMIAL DECOMPRESSION, ROTATOR CUFF REPAIR AND BICEP TENDON REPAIR Left 08/17/2023   Procedure: SHOULDER ARTHROSCOPY WITH DEBRIDEMENT, DECOMPRESSION, ROTATOR CUFF REPAIR AND BICEPS TENODESIS.;  Surgeon: Christena Flake, MD;  Location: [facility] ORS;  Service: Orthopedics;  Laterality: Left;    Social History   Tobacco Use   Smoking status: Former    Current packs/day: 0.00    Average packs/day: 1 pack/day for 26.0 years (26.0 ttl pk-yrs)    Types: Cigarettes    Start date: 36    Quit date: 2000    Years since quitting: 25.2   Smokeless tobacco: Never  Vaping Use   Vaping status: Never Used  Substance Use Topics   Alcohol use: Yes    Comment: seldom   Drug use: Never     Medication list has been reviewed and updated.  Current Meds  Medication Sig   anastrozole (ARIMIDEX) 1 MG tablet Take 1 tablet (1 mg total) by mouth daily.   Calcium Carb-Cholecalciferol (CALCIUM 600 + D PO) Take 2 tablets by mouth daily.   levothyroxine (SYNTHROID) 100 MCG tablet Take 100 mcg by mouth daily before breakfast.    Multiple Vitamin (MULTI-VITAMIN DAILY PO) Take 1 tablet by mouth daily.   naproxen sodium (ALEVE) 220 MG tablet Take 220 mg by mouth daily as needed (pain).   omeprazole (PRILOSEC) 20 MG capsule Take 1 capsule by mouth once daily   oxyCODONE (ROXICODONE) 5 MG immediate release tablet Take 1-2 tablets (5-10 mg total) by mouth every 4 (four) hours as needed for moderate pain (pain score 4-6) or severe pain (pain score 7-10).       10/18/2023    8:49 AM 10/14/2022    8:40 AM 10/08/2021    8:42 AM 03/07/2021    7:57 AM  GAD 7 : Generalized Anxiety Score  Nervous, Anxious, on Edge 0 0 0 0  Control/stop worrying 0 0 0 0  Worry too much -  different things 0 0 0 0  Trouble relaxing 0 0 0 0  Restless 0 0 0 0  Easily annoyed or irritable 0 0 0 0  Afraid - awful might happen 0 0 0 0  Total GAD 7 Score 0 0 0 0  Anxiety Difficulty Not difficult at all Not difficult at all Not difficult at all Not difficult at all       10/18/2023    8:48 AM 03/17/2023    8:57 AM 10/14/2022    8:40 AM  Depression screen PHQ 2/9  Decreased Interest 0 0 0  Down, Depressed, Hopeless 0 0 0  PHQ - 2 Score 0 0 0  Altered sleeping 0 0 0  Tired, decreased energy 0 0 0  Change in appetite 0 0 0  Feeling bad or failure about yourself  0  0 0  Trouble concentrating 0 0 0  Moving slowly or fidgety/restless 0 0 0  Suicidal thoughts 0 0 0  PHQ-9 Score 0 0 0  Difficult doing work/chores Not difficult at all Not difficult at all Not difficult at all    BP Readings from Last 3 Encounters:  10/18/23 130/74  08/17/23 137/76  06/02/23 116/79    Physical Exam Vitals and nursing note reviewed.  Constitutional:      General: She is not in acute distress.    Appearance: She is well-developed.  HENT:     Head: Normocephalic and atraumatic.     Right Ear: Tympanic membrane and ear canal normal.     Left Ear: Tympanic membrane and ear canal normal.     Nose:     Right Sinus: No maxillary sinus tenderness.     Left Sinus: No maxillary sinus tenderness.  Eyes:     General: No scleral icterus.       Right eye: No discharge.        Left eye: No discharge.     Conjunctiva/sclera: Conjunctivae normal.  Neck:     Thyroid: No thyromegaly.     Vascular: No carotid bruit.  Cardiovascular:     Rate and Rhythm: Normal rate and regular rhythm.     Pulses: Normal pulses.     Heart sounds: Normal heart sounds.  Pulmonary:     Effort: Pulmonary effort is normal. No respiratory distress.     Breath sounds: No wheezing.  Abdominal:     General: Bowel sounds are normal.     Palpations: Abdomen is soft.     Tenderness: There is no abdominal tenderness.   Musculoskeletal:     Left shoulder: Decreased range of motion.     Left hand: Tenderness present. Decreased range of motion.     Cervical back: Normal range of motion. No erythema.     Right lower leg: No edema.     Left lower leg: No edema.     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  Skin:    General: Skin is warm and dry.     Capillary Refill: Capillary refill takes less than 2 seconds.     Findings: No rash.  Neurological:     General: No focal deficit present.     Mental Status: She is alert and oriented to person, place, and time.     Cranial Nerves: No cranial nerve deficit.     Sensory: No sensory deficit.     Deep Tendon Reflexes: Reflexes are normal and symmetric.  Psychiatric:        Attention and Perception: Attention normal.        Mood and Affect: Mood normal.     Wt Readings from Last 3 Encounters:  10/18/23 204 lb 2 oz (92.6 kg)  08/17/23 193 lb (87.5 kg)  08/11/23 193 lb (87.5 kg)    BP 130/74   Pulse 64   Ht 5' 5.5" (1.664 m)   Wt 204 lb 2 oz (92.6 kg)   SpO2 98%   BMI 33.45 kg/m   Assessment and Plan:  Problem List Items Addressed This Visit       Unprioritized   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 in female, estrogen receptor positive (HCC) (Chronic)   Multinodular goiter   Followed by Endo Recent TSH 4.13      Pharyngeal dysphagia   Reflux symptoms are controlled  on omeprazole. Patient denies red flag symptoms - no melena, weight loss, dysphagia. Recent CBC normal.       Mixed hyperlipidemia   Managed with diet and exercise. Lipid panel deferred      Other Visit Diagnoses       Annual physical exam    -  Primary   continue healthy diet and exercise [place] [employer] dept hiking permit form signed     Encounter for screening mammogram for breast cancer       scheduled     Colon cancer screening       Relevant Orders   Cologuard       No follow-ups on  file.    Reubin Milan, MD [facility] [place]

## 2023-10-22 DIAGNOSIS — M25512 Pain in left shoulder: Secondary | ICD-10-CM | POA: Diagnosis not present

## 2023-10-22 DIAGNOSIS — R531 Weakness: Secondary | ICD-10-CM | POA: Diagnosis not present

## 2023-10-22 DIAGNOSIS — Z9889 Other specified postprocedural states: Secondary | ICD-10-CM | POA: Diagnosis not present

## 2023-10-22 DIAGNOSIS — M25612 Stiffness of left shoulder, not elsewhere classified: Secondary | ICD-10-CM | POA: Diagnosis not present

## 2023-10-25 DIAGNOSIS — M25612 Stiffness of left shoulder, not elsewhere classified: Secondary | ICD-10-CM | POA: Diagnosis not present

## 2023-10-25 DIAGNOSIS — Z9889 Other specified postprocedural states: Secondary | ICD-10-CM | POA: Diagnosis not present

## 2023-10-25 DIAGNOSIS — R531 Weakness: Secondary | ICD-10-CM | POA: Diagnosis not present

## 2023-10-25 DIAGNOSIS — M25512 Pain in left shoulder: Secondary | ICD-10-CM | POA: Diagnosis not present

## 2023-10-26 ENCOUNTER — Ambulatory Visit: Attending: Surgery | Admitting: Occupational Therapy

## 2023-10-26 DIAGNOSIS — M25632 Stiffness of left wrist, not elsewhere classified: Secondary | ICD-10-CM | POA: Insufficient documentation

## 2023-10-26 DIAGNOSIS — M25642 Stiffness of left hand, not elsewhere classified: Secondary | ICD-10-CM | POA: Diagnosis not present

## 2023-10-26 DIAGNOSIS — R600 Localized edema: Secondary | ICD-10-CM | POA: Diagnosis not present

## 2023-10-26 DIAGNOSIS — M25532 Pain in left wrist: Secondary | ICD-10-CM | POA: Diagnosis not present

## 2023-10-26 DIAGNOSIS — M6281 Muscle weakness (generalized): Secondary | ICD-10-CM | POA: Diagnosis not present

## 2023-10-26 NOTE — Therapy (Signed)
 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, estrogen receptor positive (HCC) 2019   a.) stage I RIGHT breast cancer (T1c N1 M0, ER/PR+ HER2/neu -); s/p systemic chemotherapy + XRT + endocrine therapy   Hashimoto's thyroiditis    Hypothyroidism    Long term current use of aromatase inhibitor    a.) anastrozole   Mixed hyperlipidemia    Multinodular goiter    Osteopenia    Personal history of chemotherapy 2019   Rt breast   Personal history of radiation therapy 2019   Rt breast   Raynaud's syndrome    Rheumatoid factor positive    Past Surgical History:  Procedure Laterality Date   BREAST BIOPSY Left    stereo-neg   BREAST BIOPSY Right 2019   BREAST LUMPECTOMY Right 2019   invasive ductal carcinoma   COLONOSCOPY     SHOULDER ARTHROSCOPY WITH SUBACROMIAL DECOMPRESSION, ROTATOR CUFF REPAIR AND BICEP TENDON REPAIR Left 08/17/2023   Procedure: SHOULDER ARTHROSCOPY WITH DEBRIDEMENT, DECOMPRESSION, ROTATOR CUFF REPAIR AND BICEPS TENODESIS.;  Surgeon: Christena Flake, MD;  Location: [facility] ORS;  Service: Orthopedics;  Laterality: Left;   Patient Active Problem List   Diagnosis Date Noted   Osteopenia 06/02/2023   Genetic testing 07/17/2022   ANA positive 10/30/2020   Rheumatoid factor positive 10/30/2020   Mixed hyperlipidemia 10/05/2020   Raynaud's syndrome 10/04/2020   Multinodular goiter 06/12/2020   Pharyngeal dysphagia 06/12/2020   Carcinoma of  upper-outer quadrant of right breast in female, estrogen receptor positive (HCC) 07/28/2018    ONSET DATE: 08/17/23  REFERRING DIAG: L shoulder surgery with increase hand edema , pain and weakness  THERAPY DIAG:  Localized edema  Stiffness of left hand, not elsewhere classified  Stiffness of left wrist, not elsewhere classified  Pain in left wrist  Muscle weakness (generalized)  Rationale for Evaluation and Treatment: Rehabilitation  SUBJECTIVE:   SUBJECTIVE STATEMENT: SEE NOTE Pt accompanied by: self  PERTINENT HISTORY: [facility] visit note 10/01/23 Skin inspection of the left hand is notable for mild swelling, but otherwise is unremarkable. No erythema, ecchymosis, abrasions, or other skin abnormalities are identified. She has mild tenderness palpation along the ulnar aspect of her wrist, but there are no other areas of tenderness around the wrist or hand. She is able to actively flex extend all digits without any pain or triggering, but is unable to fully flex her thumb, index, and long fingers. Passively, each of the index and long MCP joints can be flexed to 90 degrees, while each of the index and long PIP joints can be flexed passively to 100 degrees. She is grossly neurovascular intact to all digits, other than some subjectively decreased sensation light touch to the thumb greater than index finger tips.  Assessment: Encounter Diagnoses  Name Primary?  Nontraumatic complete tear of left rotator cuff Yes  Rotator cuff tendinitis, left  Tendinitis of upper biceps  tendon of left shoulder   Plan: The treatment options were discussed with the patient and her husband. In addition, patient educational materials were provided regarding the diagnosis and treatment options. Regarding her left shoulder, the patient is pleased with her symptomatic and functional improvement at this time. I have recommended that she continue with physical therapy and home exercises in order to optimize her  range of motion, strength, and overall function.  Regarding her left hand/wrist symptoms, most likely the symptoms are due either to the position of her arm in her sling postoperatively or perhaps her nerve being irritated by the nerve block placed preoperative by the anesthesiologist. Regardless, the patient is advised that these symptoms most likely will resolve over time. Meanwhile, the patient will be sent to occupational therapy to work on range of motion and strength exercises to the hand in order to optimize her likelihood of regaining full function. She may progress in her activities as symptoms remain, but is to avoid offending activities. He may take over-the-counter medications as needed for discomfort. All of the patient's questions and concerns were   PRECAUTIONS: No weightbearing post shoulder surgery    WEIGHT BEARING RESTRICTIONS: Nonweightbearing left upper extremity  PAIN:  Are you having pain? 2/10 volar wrist left volar wrist  FALLS: Has patient fallen in last 6 months? Yes. Number of falls 1  LIVING ENVIRONMENT: Lives with: lives with their spouse   PLOF: Patient is retired but very active playing the [employer], working the yard, gardening, Archivist and crocheting  PATIENT GOALS: I want to be able to use my hand.  Make a fist get the numbness and swelling better.  NEXT MD VISIT: Next month  OBJECTIVE:  Note: Objective measures were completed at Evaluation unless otherwise noted.  HAND DOMINANCE: Right    FUNCTIONAL OUTCOME MEASURES: To be assessed next session  UPPER EXTREMITY ROM:     Active ROM Right eval Left eval L 10/26/23  Shoulder flexion     Shoulder abduction     Shoulder adduction     Shoulder extension     Shoulder internal rotation     Shoulder external rotation     Elbow flexion     Elbow extension     Wrist flexion  70 volar wrist pain 90  Wrist extension  65 dorsal wrist pain 70 pull dorsal wrist  Wrist ulnar deviation  30 volar wrist  pain 40 pain volar wrist   Wrist radial deviation  20 volar wrist pain 25 volar wrist   Wrist pronation     Wrist supination     (Blank rows = not tested)  Active ROM Right eval Left eval L 10/26/23  Thumb MCP (0-60)     Thumb IP (0-80)     Thumb Radial abd/add (0-55)  50  WNL  Thumb Palmar abd/add (0-45)   50 WN  Thumb Opposition to Small Finger  Sharp volar wrist pain with opposition to fifth 2nd fold pain volar wrist   Index MCP (0-90)  70  80  Index PIP (0-100)   90 95  Index DIP (0-70)       Long MCP (0-90)    70 85  Long PIP (0-100)    85 95  Long DIP (0-70)       Ring MCP (0-90)    50 85  Ring PIP (0-100)    90 95  Ring DIP (0-70)       Little MCP (0-90)    40 90  Little PIP (0-100)    90 100  Little DIP (0-70)       (Blank rows = not tested)    HAND FUNCTION: 10/26/23 Grip strength: Right: NT lbs; Left: NT lbs, Lateral pinch: Right: 18 lbs, Left: 6 lbs, and 3 point pinch: Right: 17 lbs, Left: 5 lbs  COORDINATION: Decreased because of increased edema and stiffness and pain as well as numbness at eval 10/26/23 assess individual tendon glides as well as able to do different signs of digits.  Difficulty initiate as well as opposition from palm to fingertips difficulty mostly with fourth and fifth.  SENSATION: At eval patient report numbness in most of the thumb.  In the tips of second and third digits  EDEMA: Increase edema in the volar wrist as well as in the hand on the left  COGNITION: Overall cognitive status: Within functional limits for tasks assessed       TREATMENT DATE: 10/26/23         My motion is better.  In my hand and wrist.  The swelling is better.  The stiffness and pain is better.  But still have pain of my wrist.  And I cannot lift any light pots or pans  - pain hands or get tired if I played more than 2 songs of my ukulele      -I am able to use my left hand better with my bathing and dressing and eating.      Measurements for wrist and digits on  the left hand taken.  Great progress.  See flowsheet. Assess fine motor.  And dexterity as well as individual digit range of motion. Patient difficulty with individual finger range of motion, showing different signs with fingers as well as opposition of object from palm to fingertips.  Increased difficulty palmar opposition to fourth and fifth more than second and third. Starting to be able to use fourth and fifth digit for storage while using fist 3 digits for dexterity.                                                                                                  Modalities: Fluidotherapy Time: 8 Location: Left wrist and hand Prior to review of fine motor home exercises as well as strengthening for wrist.  To decrease stiffness and pain.  Blocked digits individually facilitating individual tendon glides.  Add to home exercises Followed by patient doing different signs of numbers, like 2 fingers 3 fingers peace sign different college signs. Followed by thumb on fingers and fingers on thumb opposition of Chinese ball. As well as opposition of Chinese ball as well as smaller object from thumb on second and third and thumb on fourth and fifth digit.  Add to home exercises As well as can upgrade to 2 cm object pinning up with 3-point pinch store object with fourth and fifth and palm while picking up and releasing small objects with 3-point pinch add to home exercises   Continue with active range of motion tendon glides with composite fist to palm pain-free all. Patient to continue to focus on motion  more than strength Active range of motion for thumb palmar radial abduction 10 reps Add rubber band for resistance for palmar radial abduction 2 sets of 12 2 times a day Opposition changed to alternating digits pain-free and can slide down the fifth.  8 reps  Continue with active range of motion for wrist and forearm in all planes 10 reps followed by 1 pound weight for ulnar radial deviation and  supination pronation 1-2 sets of 12 pain-free.  Patient could not tolerate Isotoner glove.  Patient fitted with Tubigrip D to wear for compression likely over palm and volar wrist.       PATIENT EDUCATION: Education details: findings of eval and HEP  Person educated: Patient Education method: Explanation, Demonstration, Tactile cues, Verbal cues, and Handouts Education comprehension: verbalized understanding, returned demonstration, verbal cues required, and needs further education  GOALS: Goals reviewed with patient? Yes  LONG TERM GOALS: Target date: 8 wks   Patient to be independent home program to increase digit flexion to touching palm without increase symptoms Baseline: Patient present with increased edema in hand and volar wrist with MC flexion 40 to 70 degrees and PIP 85-90 Goal status: INITIAL  2.  Left wrist active range of motion increased to within normal limits symptom-free for patient to use left hand in bathing and dressing Baseline: Left wrist extension 65 and flexion 70 degrees with increased pain in volar and dorsal forearm.  Radial ulnar deviation active range of motion within normal limits but increased pain over volar wrist Goal status: INITIAL  3.  Thumb active range of motion all planes improved to within normal limits for patient to do buttons, open containers and use utensils and brush. Baseline: Thumb palmar radial abduction 50 degrees but increased tightness with pull.  Opposition to DIP of fifth with increased pain over volar wrist sharp pain Goal status: INITIAL  4.  Left grip and prehension strength improved to within functional limits for her age for patient to use hand and more than 50% of ADLs without increase symptoms Baseline: Patient with a increase edema and pain and decreased flexion of digits-grip and prehension not assessed patient 2 weeks out of sling.  Per patient PT initiating shoulder strength next week. Goal status: INITIAL  5.  L wrist  and digits AROM increased to within normal limits to perform 75% of ADLs symptom-free Baseline: Decreased wrist and digit active range of motion, increased edema and pain using in ADLs less than 25% Goal status: INITIAL  ASSESSMENT:  CLINICAL IMPRESSION: Patient seen by occupational therapy for left hand edema, numbness, pain and weakness.  Patient had left shoulder surgery with a nerve block on 08/17/2023.  Per patient immobilized until middle March.  Patient with increased edema mostly over volar wrist, carpal tunnel and hand.  Patient present with decreased flexion of digits with increased pain.  Patient with decreased wrist flexion extension with increased pain at proximal volar and dorsal forearm.  Patient report numbness in thumb and DIP of second and third.  Patient present at first follow-up with increased digit range of motion as well as wrist in all planes.  Pain is decreasing.  Continue have increased edema over volar wrist with increased tenderness and pain.  Decreased fine motor and dexterity.  Upgrade home exercises this date.  Patient limited in functional use of left hand in ADLs and IADLs.  Patient can benefit from skilled OT services to decrease edema and pain and increase motion.  Patient to keep pain under 2/10  focusing on decreasing edema and pain and motion.  Will address strength after these factors.  Patient can benefit from skilled OT services to increase use of left hand in ADLs and IADLs to return to prior level of function.  PERFORMANCE DEFICITS: in functional skills including ADLs, IADLs, ROM, strength, pain, flexibility, decreased knowledge of use of DME, and UE functional use,   and psychosocial skills including environmental adaptation and routines and behaviors.   IMPAIRMENTS: are limiting patient from ADLs, IADLs, rest and sleep, play, leisure, and social participation.   COMORBIDITIES: has no other co-morbidities that affects occupational performance. Patient will  benefit from skilled OT to address above impairments and improve overall function.  MODIFICATION OR ASSISTANCE TO COMPLETE EVALUATION: No modification of tasks or assist necessary to complete an evaluation.  OT OCCUPATIONAL PROFILE AND HISTORY: Problem focused assessment: Including review of records relating to presenting problem.  CLINICAL DECISION MAKING: LOW - limited treatment options, no task modification necessary  REHAB POTENTIAL: Good for goals  EVALUATION COMPLEXITY: Low   PLAN:  OT FREQUENCY: 1-2x/week  OT DURATION: 6 wks   PLANNED INTERVENTIONS: 97168 OT Re-evaluation, 97535 self care/ADL training, 86578 therapeutic exercise, 97530 therapeutic activity, 97112 neuromuscular re-education, 97140 manual therapy, 97018 paraffin, 46962 fluidotherapy, 97034 contrast bath, 97760 Splinting (initial encounter), passive range of motion, patient/family education, and DME and/or AE instructions  CONSULTED AND AGREED WITH PLAN OF CARE: Patient   Oletta Cohn, OTR/L,CLT 10/26/2023, 8:18 AM

## 2023-10-27 ENCOUNTER — Ambulatory Visit
Admission: RE | Admit: 2023-10-27 | Discharge: 2023-10-27 | Disposition: A | Discharge status: Home / Self Care | Payer: Medicare Other | Source: Ambulatory Visit | Attending: Oncology | Admitting: Oncology

## 2023-10-27 DIAGNOSIS — Z08 Encounter for follow-up examination after completed treatment for malignant neoplasm: Secondary | ICD-10-CM

## 2023-10-27 DIAGNOSIS — Z1231 Encounter for screening mammogram for malignant neoplasm of breast: Secondary | ICD-10-CM | POA: Diagnosis not present

## 2023-10-28 DIAGNOSIS — Z9889 Other specified postprocedural states: Secondary | ICD-10-CM | POA: Diagnosis not present

## 2023-10-28 DIAGNOSIS — M25612 Stiffness of left shoulder, not elsewhere classified: Secondary | ICD-10-CM | POA: Diagnosis not present

## 2023-10-28 DIAGNOSIS — M25512 Pain in left shoulder: Secondary | ICD-10-CM | POA: Diagnosis not present

## 2023-10-28 DIAGNOSIS — R531 Weakness: Secondary | ICD-10-CM | POA: Diagnosis not present

## 2023-10-29 ENCOUNTER — Ambulatory Visit: Admitting: Occupational Therapy

## 2023-10-29 DIAGNOSIS — R600 Localized edema: Secondary | ICD-10-CM | POA: Diagnosis not present

## 2023-10-29 DIAGNOSIS — M6281 Muscle weakness (generalized): Secondary | ICD-10-CM

## 2023-10-29 DIAGNOSIS — M25532 Pain in left wrist: Secondary | ICD-10-CM | POA: Diagnosis not present

## 2023-10-29 DIAGNOSIS — M25642 Stiffness of left hand, not elsewhere classified: Secondary | ICD-10-CM

## 2023-10-29 DIAGNOSIS — M25632 Stiffness of left wrist, not elsewhere classified: Secondary | ICD-10-CM

## 2023-10-29 NOTE — Therapy (Signed)
 OUTPATIENT OCCUPATIONAL THERAPY ORTHO TREATMENT  Patient Name: Alexa Knight MRN: [id number] DOB:11-29-1955, 68 y.o., female Today's Date: 10/29/2023  PCP: Dr Alisa Graff PROVIDER: Dr Joice Lofts  END OF SESSION:  OT End of Session - 10/29/23 0943     Visit Number 3    Number of Visits 8    Date for OT Re-Evaluation 11/29/23    OT Start Time 0944    OT Stop Time 1033    OT Time Calculation (min) 49 min    Activity Tolerance Patient tolerated treatment well    Behavior During Therapy [facility] for tasks assessed/performed             Past Medical History:  Diagnosis Date   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, estrogen receptor positive (HCC) 2019   a.) stage I RIGHT breast cancer (T1c N1 M0, ER/PR+ HER2/neu -); s/p systemic chemotherapy + XRT + endocrine therapy   Hashimoto's thyroiditis    Hypothyroidism    Long term current use of aromatase inhibitor    a.) anastrozole   Mixed hyperlipidemia    Multinodular goiter    Osteopenia    Personal history of chemotherapy 2019   Rt breast   Personal history of radiation therapy 2019   Rt breast   Raynaud's syndrome    Rheumatoid factor positive    Past Surgical History:  Procedure Laterality Date   BREAST BIOPSY Left    stereo-neg   BREAST BIOPSY Right 2019   BREAST LUMPECTOMY Right 2019   invasive ductal carcinoma   COLONOSCOPY     SHOULDER ARTHROSCOPY WITH SUBACROMIAL DECOMPRESSION, ROTATOR CUFF REPAIR AND BICEP TENDON REPAIR Left 08/17/2023   Procedure: SHOULDER ARTHROSCOPY WITH DEBRIDEMENT, DECOMPRESSION, ROTATOR CUFF REPAIR AND BICEPS TENODESIS.;  Surgeon: Christena Flake, MD;  Location: [facility] ORS;  Service: Orthopedics;  Laterality: Left;   Patient Active Problem List   Diagnosis Date Noted   Osteopenia 06/02/2023   Genetic testing 07/17/2022   ANA positive 10/30/2020   Rheumatoid factor positive 10/30/2020   Mixed hyperlipidemia 10/05/2020   Raynaud's syndrome 10/04/2020   Multinodular goiter  06/12/2020   Pharyngeal dysphagia 06/12/2020   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 in female, estrogen receptor positive (HCC) 07/28/2018    ONSET DATE: 08/17/23  REFERRING DIAG: L shoulder surgery with increase hand edema , pain and weakness  THERAPY DIAG:  Localized edema  Stiffness of left hand, not elsewhere classified  Stiffness of left wrist, not elsewhere classified  Pain in left wrist  Muscle weakness (generalized)  Rationale for Evaluation and Treatment: Rehabilitation  SUBJECTIVE:   SUBJECTIVE STATEMENT: SEE NOTE Pt accompanied by: self  PERTINENT HISTORY: [facility] visit note 10/01/23 Skin inspection of the left hand is notable for mild swelling, but otherwise is unremarkable. No erythema, ecchymosis, abrasions, or other skin abnormalities are identified. She has mild tenderness palpation along the ulnar aspect of her wrist, but there are no other areas of tenderness around the wrist or hand. She is able to actively flex extend all digits without any pain or triggering, but is unable to fully flex her thumb, index, and long fingers. Passively, each of the index and long MCP joints can be flexed to 90 degrees, while each of the index and long PIP joints can be flexed passively to 100 degrees. She is grossly neurovascular intact to all digits, other than some subjectively decreased sensation light touch to the thumb greater than index finger tips.  Assessment: Encounter Diagnoses  Name Primary?  Nontraumatic complete  tear of left rotator cuff Yes  Rotator cuff tendinitis, left  Tendinitis of upper biceps tendon of left shoulder   Plan: The treatment options were discussed with the patient and her husband. In addition, patient educational materials were provided regarding the diagnosis and treatment options. Regarding her left shoulder, the patient is pleased with her symptomatic and functional improvement at this time. I have recommended that she continue with  physical therapy and home exercises in order to optimize her range of motion, strength, and overall function.  Regarding her left hand/wrist symptoms, most likely the symptoms are due either to the position of her arm in her sling postoperatively or perhaps her nerve being irritated by the nerve block placed preoperative by the anesthesiologist. Regardless, the patient is advised that these symptoms most likely will resolve over time. Meanwhile, the patient will be sent to occupational therapy to work on range of motion and strength exercises to the hand in order to optimize her likelihood of regaining full function. She may progress in her activities as symptoms remain, but is to avoid offending activities. He may take over-the-counter medications as needed for discomfort. All of the patient's questions and concerns were   PRECAUTIONS: No weightbearing post shoulder surgery    WEIGHT BEARING RESTRICTIONS: Nonweightbearing left upper extremity  PAIN:  Are you having pain? 2/10 volar wrist left volar wrist  FALLS: Has patient fallen in last 6 months? Yes. Number of falls 1  LIVING ENVIRONMENT: Lives with: lives with their spouse   PLOF: Patient is retired but very active playing the [employer], working the yard, gardening, Archivist and crocheting  PATIENT GOALS: I want to be able to use my hand.  Make a fist get the numbness and swelling better.  NEXT MD VISIT: Next month  OBJECTIVE:  Note: Objective measures were completed at Evaluation unless otherwise noted.  HAND DOMINANCE: Right    FUNCTIONAL OUTCOME MEASURES: To be assessed next session  UPPER EXTREMITY ROM:     Active ROM Right eval Left eval L 10/26/23 L 10/29/23  Shoulder flexion      Shoulder abduction      Shoulder adduction      Shoulder extension      Shoulder internal rotation      Shoulder external rotation      Elbow flexion      Elbow extension      Wrist flexion  70 volar wrist pain 90 90 pain over dorsal  wrist   Wrist extension  65 dorsal wrist pain 70 pull dorsal wrist 70 pull over dorsal wrist   Wrist ulnar deviation  30 volar wrist pain 40 pain volar wrist  40  Wrist radial deviation  20 volar wrist pain 25 volar wrist  25  Wrist pronation    90  Wrist supination    90  (Blank rows = not tested)  Active ROM Right eval Left eval L 10/26/23 L 10/29/23  Thumb MCP (0-60)      Thumb IP (0-80)      Thumb Radial abd/add (0-55)  50  WNL   Thumb Palmar abd/add (0-45)   50 WN   Thumb Opposition to Small Finger  Sharp volar wrist pain with opposition to fifth 2nd fold pain volar wrist    Index MCP (0-90)  70  80 85  Index PIP (0-100)   90 95 95  Index DIP (0-70)        Long MCP (0-90)    70 85 85  Long PIP (0-100)    85 95 100  Long DIP (0-70)        Ring MCP (0-90)    50 85 90  Ring PIP (0-100)    90 95 95  Ring DIP (0-70)        Little MCP (0-90)    40 90 90  Little PIP (0-100)    90 100 100  Little DIP (0-70)        (Blank rows = not tested)    HAND FUNCTION: 10/26/23 Grip strength: Right: NT lbs; Left: NT lbs, Lateral pinch: Right: 18 lbs, Left: 6 lbs, and 3 point pinch: Right: 17 lbs, Left: 5 lbs 10/29/23 Grip strength: Right: NT lbs; Left: NT lbs, Lateral pinch: Right: 18 lbs, Left: 8 lbs, and 3 point pinch: Right: 17 lbs, Left: 8 lbs  COORDINATION: Decreased because of increased edema and stiffness and pain as well as numbness at eval 10/26/23 assess individual tendon glides as well as able to do different signs of digits.  Difficulty initiate as well as opposition from palm to fingertips difficulty mostly with fourth and fifth.  SENSATION: At eval patient report numbness in most of the thumb.  In the tips of second and third digits  EDEMA: Increase edema in the volar wrist as well as in the hand on the left  10/29/23 patient report increased fullness and heaviness in the upper arm on the left.  Since surgery. See lymphedema flowsheet for circumferences  COGNITION: Overall  cognitive status: Within functional limits for tasks assessed       TREATMENT DATE: 10/29/23         Patient arrived with reports of increased motion.  Pain decreasing swelling decreasing in left hand and wrist. Continue to have edema on the volar wrist.  Reports she forgot to use the Tubigrip. Could not tolerate the Isotoner glove since she was fitted with that of the eval. Measurements taken see flowsheet.  Progress in pain at the wrist for ulnar radial deviation as well as increased digit flexion. Increased coordination reported per patient with Chinese ball manipulation and opposition for thumb 1 fingers and fingers and thumb as well as ulnar radial part of hand. Patient was added on MLD to do in the morning in the evening before she gets out of bed elevate arm at 90 degrees shoulder flexion and do some some MLD pumping likely from hand over volar wrist to elbow and then outside of upper arm to shoulder.  Can do 20 reps twice a day. Patient do report this date increased swelling and heaviness in the upper arm since surgery.  Circumference measurement Upper arm 15 cm from elbow: Right 35.5 and left 36.5 Upper arm 10 cm from elbow: Right 32.4 and left 33.4 cm Elbow : Right 26.5 and left 26.5 cm  Patient fitted with a larger Isotoner glove on left hand in combination to be wear with Tubigrip D from hand to elbow and then Tubigrip E from proximal glove to upper arm.  Patient encouraged to use it at nighttime.  As well as during the day when at home sitting or resting. Remind patient to make sure he does not do a tourniquet at the upper arm or rolling the Tubigrip.  Modalities: Fluidotherapy Time: 8 Location: Left wrist and hand Prior to review of fine motor home exercises as well as strengthening for wrist.  To decrease stiffness and pain. Done 1 rotation of ice for contrast Patient to continue with contrast.   Patient can use Epsom salt and warm water for the heat component.  Blocked digits individually facilitating individual tendon glides.  Add to home exercises   Continue with active range of motion tendon glides with composite fist to palm pain-free all. Patient to continue to focus on motion more than strength Active range of motion for thumb palmar radial abduction 10 reps Reviewed with patient again rubber band for resistance for palmar ,radial abduction 2 sets of 12 2 times a day Opposition changed to alternating digits pain-free and can slide down the fifth.  8 reps  Continue with active range of motion for wrist and forearm in all planes 10 reps followed by 1 pound weight for ulnar radial deviation and supination pronation 1-2 sets of 12 pain-free.         PATIENT EDUCATION: Education details: findings of eval and HEP  Person educated: Patient Education method: Explanation, Demonstration, Tactile cues, Verbal cues, and Handouts Education comprehension: verbalized understanding, returned demonstration, verbal cues required, and needs further education  GOALS: Goals reviewed with patient? Yes  LONG TERM GOALS: Target date: 8 wks   Patient to be independent home program to increase digit flexion to touching palm without increase symptoms Baseline: Patient present with increased edema in hand and volar wrist with MC flexion 40 to 70 degrees and PIP 85-90 Goal status: INITIAL  2.  Left wrist active range of motion increased to within normal limits symptom-free for patient to use left hand in bathing and dressing Baseline: Left wrist extension 65 and flexion 70 degrees with increased pain in volar and dorsal forearm.  Radial ulnar deviation active range of motion within normal limits but increased pain over volar wrist Goal status: INITIAL  3.  Thumb active range of motion all planes improved to within normal limits for patient to do buttons, open containers and use utensils and  brush. Baseline: Thumb palmar radial abduction 50 degrees but increased tightness with pull.  Opposition to DIP of fifth with increased pain over volar wrist sharp pain Goal status: INITIAL  4.  Left grip and prehension strength improved to within functional limits for her age for patient to use hand and more than 50% of ADLs without increase symptoms Baseline: Patient with a increase edema and pain and decreased flexion of digits-grip and prehension not assessed patient 2 weeks out of sling.  Per patient PT initiating shoulder strength next week. Goal status: INITIAL  5.  L wrist and digits AROM increased to within normal limits to perform 75% of ADLs symptom-free Baseline: Decreased wrist and digit active range of motion, increased edema and pain using in ADLs less than 25% Goal status: INITIAL  ASSESSMENT:  CLINICAL IMPRESSION: Patient seen by occupational therapy for left hand edema, numbness, pain and weakness.  Patient had left shoulder surgery with a nerve block on 08/17/2023.  Per patient immobilized until middle March.  Patient with increased edema mostly over volar wrist, carpal tunnel and hand.  Patient present with decreased flexion of digits with increased pain.  Patient with decreased wrist flexion extension with increased pain at proximal volar and dorsal forearm.  Patient report numbness in thumb and DIP of second and third.  Since evaluation patient is making great progress with edema and  left hand and wrist as well as increased digit and wrist range of motion with decreased pain.  Patient continues to have some increased edema over the volar wrist with increased pain with wrist flexion extension.  This date patient report increased edema in left upper arm since surgery.  Upon measuring patient increased by 1 cm nondominant side left side.  Patient was fitted with a larger Isotoner glove with Tubigrip D and E for gradient compression to be worn at nighttime but can also do it during  the day to facilitate some lymphatic flow and decrease edema in left upper extremity.  Patient limited in functional use of left hand in ADLs and IADLs.  Patient can benefit from skilled OT services to decrease edema and pain and increase motion.  Patient to keep pain under 2/10 focusing on decreasing edema and pain and motion.  Will address strength after these factors.  Patient can benefit from skilled OT services to increase use of left hand in ADLs and IADLs to return to prior level of function.  PERFORMANCE DEFICITS: in functional skills including ADLs, IADLs, ROM, strength, pain, flexibility, decreased knowledge of use of DME, and UE functional use,   and psychosocial skills including environmental adaptation and routines and behaviors.   IMPAIRMENTS: are limiting patient from ADLs, IADLs, rest and sleep, play, leisure, and social participation.   COMORBIDITIES: has no other co-morbidities that affects occupational performance. Patient will benefit from skilled OT to address above impairments and improve overall function.  MODIFICATION OR ASSISTANCE TO COMPLETE EVALUATION: No modification of tasks or assist necessary to complete an evaluation.  OT OCCUPATIONAL PROFILE AND HISTORY: Problem focused assessment: Including review of records relating to presenting problem.  CLINICAL DECISION MAKING: LOW - limited treatment options, no task modification necessary  REHAB POTENTIAL: Good for goals  EVALUATION COMPLEXITY: Low   PLAN:  OT FREQUENCY: 1-2x/week  OT DURATION: 6 wks   PLANNED INTERVENTIONS: 97168 OT Re-evaluation, 97535 self care/ADL training, 16109 therapeutic exercise, 97530 therapeutic activity, 97112 neuromuscular re-education, 97140 manual therapy, 97018 paraffin, 60454 fluidotherapy, 97034 contrast bath, 97760 Splinting (initial encounter), passive range of motion, patient/family education, and DME and/or AE instructions  CONSULTED AND AGREED WITH PLAN OF CARE:  Patient   Oletta Cohn, OTR/L,CLT 10/29/2023, 12:55 PM

## 2023-11-01 DIAGNOSIS — Z9889 Other specified postprocedural states: Secondary | ICD-10-CM | POA: Diagnosis not present

## 2023-11-01 DIAGNOSIS — Z1211 Encounter for screening for malignant neoplasm of colon: Secondary | ICD-10-CM | POA: Diagnosis not present

## 2023-11-01 DIAGNOSIS — M25512 Pain in left shoulder: Secondary | ICD-10-CM | POA: Diagnosis not present

## 2023-11-02 ENCOUNTER — Ambulatory Visit: Admitting: Occupational Therapy

## 2023-11-04 ENCOUNTER — Encounter: Payer: Self-pay | Admitting: Occupational Therapy

## 2023-11-04 ENCOUNTER — Ambulatory Visit: Admitting: Occupational Therapy

## 2023-11-04 DIAGNOSIS — M25632 Stiffness of left wrist, not elsewhere classified: Secondary | ICD-10-CM

## 2023-11-04 DIAGNOSIS — M25532 Pain in left wrist: Secondary | ICD-10-CM | POA: Diagnosis not present

## 2023-11-04 DIAGNOSIS — M6281 Muscle weakness (generalized): Secondary | ICD-10-CM

## 2023-11-04 DIAGNOSIS — R600 Localized edema: Secondary | ICD-10-CM | POA: Diagnosis not present

## 2023-11-04 DIAGNOSIS — M25642 Stiffness of left hand, not elsewhere classified: Secondary | ICD-10-CM | POA: Diagnosis not present

## 2023-11-04 NOTE — Therapy (Signed)
 OUTPATIENT OCCUPATIONAL THERAPY ORTHO TREATMENT  Patient Name: Alexa Knight MRN: [id number] DOB:1955/11/30, 68 y.o., female Today's Date: 11/10/2023  PCP: Dr Lourena Royal PROVIDER: Dr Daun Epstein  END OF SESSION:  OT End of Session - 11/10/23 1407     Visit Number 4    Number of Visits 8    Date for OT Re-Evaluation 11/29/23    OT Start Time 1314    OT Stop Time 1400    OT Time Calculation (min) 46 min    Activity Tolerance Patient tolerated treatment well    Behavior During Therapy [facility] for tasks assessed/performed             Past Medical History:  Diagnosis Date   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, estrogen receptor positive (HCC) 2019   a.) stage I RIGHT breast cancer (T1c N1 M0, ER/PR+ HER2/neu -); s/p systemic chemotherapy + XRT + endocrine therapy   Hashimoto's thyroiditis    Hypothyroidism    Long term current use of aromatase inhibitor    a.) anastrozole   Mixed hyperlipidemia    Multinodular goiter    Osteopenia    Personal history of chemotherapy 2019   Rt breast   Personal history of radiation therapy 2019   Rt breast   Raynaud's syndrome    Rheumatoid factor positive    Past Surgical History:  Procedure Laterality Date   BREAST BIOPSY Left    stereo-neg   BREAST BIOPSY Right 2019   BREAST LUMPECTOMY Right 2019   invasive ductal carcinoma   COLONOSCOPY     SHOULDER ARTHROSCOPY WITH SUBACROMIAL DECOMPRESSION, ROTATOR CUFF REPAIR AND BICEP TENDON REPAIR Left 08/17/2023   Procedure: SHOULDER ARTHROSCOPY WITH DEBRIDEMENT, DECOMPRESSION, ROTATOR CUFF REPAIR AND BICEPS TENODESIS.;  Surgeon: Elner Hahn, MD;  Location: [facility] ORS;  Service: Orthopedics;  Laterality: Left;   Patient Active Problem List   Diagnosis Date Noted   Osteopenia 06/02/2023   Genetic testing 07/17/2022   ANA positive 10/30/2020   Rheumatoid factor positive 10/30/2020   Mixed hyperlipidemia 10/05/2020   Raynaud's syndrome 10/04/2020   Multinodular goiter  06/12/2020   Pharyngeal dysphagia 06/12/2020   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 in female, estrogen receptor positive (HCC) 07/28/2018    ONSET DATE: 08/17/23  REFERRING DIAG: L shoulder surgery with increase hand edema , pain and weakness  THERAPY DIAG:  Stiffness of left hand, not elsewhere classified  Localized edema  Stiffness of left wrist, not elsewhere classified  Pain in left wrist  Muscle weakness (generalized)  Rationale for Evaluation and Treatment: Rehabilitation  SUBJECTIVE:   SUBJECTIVE STATEMENT: SEE NOTE Pt accompanied by: self  PERTINENT HISTORY: [facility] visit note 10/01/23 Skin inspection of the left hand is notable for mild swelling, but otherwise is unremarkable. No erythema, ecchymosis, abrasions, or other skin abnormalities are identified. She has mild tenderness palpation along the ulnar aspect of her wrist, but there are no other areas of tenderness around the wrist or hand. She is able to actively flex extend all digits without any pain or triggering, but is unable to fully flex her thumb, index, and long fingers. Passively, each of the index and long MCP joints can be flexed to 90 degrees, while each of the index and long PIP joints can be flexed passively to 100 degrees. She is grossly neurovascular intact to all digits, other than some subjectively decreased sensation light touch to the thumb greater than index finger tips.  Assessment: Encounter Diagnoses  Name Primary?  Nontraumatic complete  tear of left rotator cuff Yes  Rotator cuff tendinitis, left  Tendinitis of upper biceps tendon of left shoulder   Plan: The treatment options were discussed with the patient and her husband. In addition, patient educational materials were provided regarding the diagnosis and treatment options. Regarding her left shoulder, the patient is pleased with her symptomatic and functional improvement at this time. I have recommended that she continue with  physical therapy and home exercises in order to optimize her range of motion, strength, and overall function.  Regarding her left hand/wrist symptoms, most likely the symptoms are due either to the position of her arm in her sling postoperatively or perhaps her nerve being irritated by the nerve block placed preoperative by the anesthesiologist. Regardless, the patient is advised that these symptoms most likely will resolve over time. Meanwhile, the patient will be sent to occupational therapy to work on range of motion and strength exercises to the hand in order to optimize her likelihood of regaining full function. She may progress in her activities as symptoms remain, but is to avoid offending activities. He may take over-the-counter medications as needed for discomfort. All of the patient's questions and concerns were   PRECAUTIONS: No weightbearing post shoulder surgery    WEIGHT BEARING RESTRICTIONS: Nonweightbearing left upper extremity  PAIN:  Are you having pain? 2/10 volar wrist left volar wrist  FALLS: Has patient fallen in last 6 months? Yes. Number of falls 1  LIVING ENVIRONMENT: Lives with: lives with their spouse   PLOF: Patient is retired but very active playing the [employer], working the yard, gardening, Archivist and crocheting  PATIENT GOALS: I want to be able to use my hand.  Make a fist get the numbness and swelling better.  NEXT MD VISIT: Next month  OBJECTIVE:  Note: Objective measures were completed at Evaluation unless otherwise noted.  HAND DOMINANCE: Right    FUNCTIONAL OUTCOME MEASURES: To be assessed next session  UPPER EXTREMITY ROM:     Active ROM Right eval Left eval L 10/26/23 L 10/29/23  Shoulder flexion      Shoulder abduction      Shoulder adduction      Shoulder extension      Shoulder internal rotation      Shoulder external rotation      Elbow flexion      Elbow extension      Wrist flexion  70 volar wrist pain 90 90 pain over dorsal  wrist   Wrist extension  65 dorsal wrist pain 70 pull dorsal wrist 70 pull over dorsal wrist   Wrist ulnar deviation  30 volar wrist pain 40 pain volar wrist  40  Wrist radial deviation  20 volar wrist pain 25 volar wrist  25  Wrist pronation    90  Wrist supination    90  (Blank rows = not tested)  Active ROM Right eval Left eval L 10/26/23 L 10/29/23  Thumb MCP (0-60)      Thumb IP (0-80)      Thumb Radial abd/add (0-55)  50  WNL   Thumb Palmar abd/add (0-45)   50 WN   Thumb Opposition to Small Finger  Sharp volar wrist pain with opposition to fifth 2nd fold pain volar wrist    Index MCP (0-90)  70  80 85  Index PIP (0-100)   90 95 95  Index DIP (0-70)        Long MCP (0-90)    70 85 85  Long PIP (0-100)    85 95 100  Long DIP (0-70)        Ring MCP (0-90)    50 85 90  Ring PIP (0-100)    90 95 95  Ring DIP (0-70)        Little MCP (0-90)    40 90 90  Little PIP (0-100)    90 100 100  Little DIP (0-70)        (Blank rows = not tested)    HAND FUNCTION: 10/26/23 Grip strength: Right: NT lbs; Left: NT lbs, Lateral pinch: Right: 18 lbs, Left: 6 lbs, and 3 point pinch: Right: 17 lbs, Left: 5 lbs 10/29/23 Grip strength: Right: NT lbs; Left: NT lbs, Lateral pinch: Right: 18 lbs, Left: 8 lbs, and 3 point pinch: Right: 17 lbs, Left: 8 lbs  COORDINATION: Decreased because of increased edema and stiffness and pain as well as numbness at eval 10/26/23 assess individual tendon glides as well as able to do different signs of digits.  Difficulty initiate as well as opposition from palm to fingertips difficulty mostly with fourth and fifth.  SENSATION: At eval patient report numbness in most of the thumb.  In the tips of second and third digits  EDEMA: Increase edema in the volar wrist as well as in the hand on the left  10/29/23 patient report increased fullness and heaviness in the upper arm on the left.  Since surgery. See lymphedema flowsheet for circumferences  COGNITION: Overall  cognitive status: Within functional limits for tasks assessed   TREATMENT DATE: 11/04/23         Pt reports she tried the glove and did well for one night, then the next night she had increased pain and removed it.  For the last night she wore just the tubigrip 1/2 arm, no glove or upper arm tubigrip.  She feels her edema in upper arm has decreased and motion in her hand has improved.    Pt continues to have difficulty with tolerating the Isotoner glove Measurements taken see flowsheet.  Continued progress with decreased pain at the wrist for ulnar radial deviation as well as increased digit flexion. Patient performing MLD in supine, the morning in the evening before she gets out of bed elevate arm at 90 degrees shoulder flexion and do some some MLD pumping likely from hand over volar wrist to elbow and then outside of upper arm to shoulder.  Can do 20 reps twice a day.  Circumferential measurements: Upper arm 15 cm from elbow: Right 35.5 and left 36.5 Upper arm 10 cm from elbow: Right 32.4 and left 33.4 cm Elbow : Right 26.5 and left 26.5 cm                                                                            Modalities: Fluidotherapy Time: 8 Location: Left wrist and hand Performed to decrease pain, decreased stiffness and increase tissue mobility.  Performed Prior to review of fine motor home exercises as well as strengthening for wrist.  Patient to continue with contrast at home for edema and can continue to use Epsom salt and warm water for the heat component.  Blocked digits individually facilitating individual  tendon glides Active range of motion tendon glides with composite fist to palm pain-free all. Patient to continue to focus on motion more than strength Active range of motion for thumb palmar radial abduction 10 reps Reviewed  rubber band for resistance for palmar ,radial abduction and to increase 3 sets of 12 2 times a day Opposition with alternating digits pain-free and  can slide down the fifth.  8 reps  Continue with active range of motion for wrist and forearm in all planes 10 reps followed by 1 pound weight for ulnar radial deviation and supination pronation 3 sets of 12 pain-free.    PATIENT EDUCATION: Education details: findings of eval and HEP  Person educated: Patient Education method: Explanation, Demonstration, Tactile cues, Verbal cues, and Handouts Education comprehension: verbalized understanding, returned demonstration, verbal cues required, and needs further education  GOALS: Goals reviewed with patient? Yes  LONG TERM GOALS: Target date: 8 wks   Patient to be independent home program to increase digit flexion to touching palm without increase symptoms Baseline: Patient present with increased edema in hand and volar wrist with MC flexion 40 to 70 degrees and PIP 85-90 Goal status: INITIAL  2.  Left wrist active range of motion increased to within normal limits symptom-free for patient to use left hand in bathing and dressing Baseline: Left wrist extension 65 and flexion 70 degrees with increased pain in volar and dorsal forearm.  Radial ulnar deviation active range of motion within normal limits but increased pain over volar wrist Goal status: INITIAL  3.  Thumb active range of motion all planes improved to within normal limits for patient to do buttons, open containers and use utensils and brush. Baseline: Thumb palmar radial abduction 50 degrees but increased tightness with pull.  Opposition to DIP of fifth with increased pain over volar wrist sharp pain Goal status: INITIAL  4.  Left grip and prehension strength improved to within functional limits for her age for patient to use hand and more than 50% of ADLs without increase symptoms Baseline: Patient with a increase edema and pain and decreased flexion of digits-grip and prehension not assessed patient 2 weeks out of sling.  Per patient PT initiating shoulder strength next week. Goal  status: INITIAL  5.  L wrist and digits AROM increased to within normal limits to perform 75% of ADLs symptom-free Baseline: Decreased wrist and digit active range of motion, increased edema and pain using in ADLs less than 25% Goal status: INITIAL  ASSESSMENT:  CLINICAL IMPRESSION: Patient seen by occupational therapy for left hand edema, numbness, pain and weakness.  Patient had left shoulder surgery with a nerve block on 08/17/2023.  Per patient immobilized until middle March.  Patient with increased edema mostly over volar wrist, carpal tunnel and hand.  Patient present with decreased flexion of digits with increased pain.  Patient with decreased wrist flexion extension with increased pain at proximal volar and dorsal forearm.  Patient report numbness in thumb and DIP of second and third.  Since evaluation patient is making great progress with edema and left hand and wrist as well as increased digit and wrist range of motion with decreased pain.  Patient continues to have some increased edema over the volar wrist with increased pain with wrist flexion extension. Patient to continue with Tubigrip D and E for gradient compression to be worn at nighttime but can also do it during the day to facilitate some lymphatic flow and decrease edema in left upper extremity. Recommended pt  monitor edema in hand and fingers if not using compression glove.  Measurements this date demonstrates decreased edema and no increases in the hand or digits currently.  Increased exercises to 3 sets over the next week prior to next session. Patient limited in functional use of left hand in ADLs and IADLs.  Patient can benefit from skilled OT services to decrease edema and pain and increase motion.  Patient to keep pain under 2/10 focusing on decreasing edema and pain and motion.  Will address strength after these factors.  Patient can benefit from skilled OT services to increase use of left hand in ADLs and IADLs to return to prior  level of function.  PERFORMANCE DEFICITS: in functional skills including ADLs, IADLs, ROM, strength, pain, flexibility, decreased knowledge of use of DME, and UE functional use,   and psychosocial skills including environmental adaptation and routines and behaviors.   IMPAIRMENTS: are limiting patient from ADLs, IADLs, rest and sleep, play, leisure, and social participation.   COMORBIDITIES: has no other co-morbidities that affects occupational performance. Patient will benefit from skilled OT to address above impairments and improve overall function.  MODIFICATION OR ASSISTANCE TO COMPLETE EVALUATION: No modification of tasks or assist necessary to complete an evaluation.  OT OCCUPATIONAL PROFILE AND HISTORY: Problem focused assessment: Including review of records relating to presenting problem.  CLINICAL DECISION MAKING: LOW - limited treatment options, no task modification necessary  REHAB POTENTIAL: Good for goals  EVALUATION COMPLEXITY: Low   PLAN:  OT FREQUENCY: 1-2x/week  OT DURATION: 6 wks   PLANNED INTERVENTIONS: 97168 OT Re-evaluation, 97535 self care/ADL training, 40981 therapeutic exercise, 97530 therapeutic activity, 97112 neuromuscular re-education, 97140 manual therapy, 97018 paraffin, 19147 fluidotherapy, 97034 contrast bath, 97760 Splinting (initial encounter), passive range of motion, patient/family education, and DME and/or AE instructions  CONSULTED AND AGREED WITH PLAN OF CARE: Patient   Sira Adsit, OTR/L,CLT 11/10/2023, 2:07 PM

## 2023-11-05 DIAGNOSIS — M25512 Pain in left shoulder: Secondary | ICD-10-CM | POA: Diagnosis not present

## 2023-11-05 DIAGNOSIS — Z9889 Other specified postprocedural states: Secondary | ICD-10-CM | POA: Diagnosis not present

## 2023-11-07 ENCOUNTER — Encounter: Payer: Self-pay | Admitting: Internal Medicine

## 2023-11-07 LAB — COLOGUARD: COLOGUARD: NEGATIVE

## 2023-11-08 DIAGNOSIS — M25512 Pain in left shoulder: Secondary | ICD-10-CM | POA: Diagnosis not present

## 2023-11-08 DIAGNOSIS — Z9889 Other specified postprocedural states: Secondary | ICD-10-CM | POA: Diagnosis not present

## 2023-11-08 DIAGNOSIS — R531 Weakness: Secondary | ICD-10-CM | POA: Diagnosis not present

## 2023-11-08 DIAGNOSIS — M25612 Stiffness of left shoulder, not elsewhere classified: Secondary | ICD-10-CM | POA: Diagnosis not present

## 2023-11-11 ENCOUNTER — Ambulatory Visit: Admitting: Occupational Therapy

## 2023-11-11 DIAGNOSIS — M6281 Muscle weakness (generalized): Secondary | ICD-10-CM | POA: Diagnosis not present

## 2023-11-11 DIAGNOSIS — M25632 Stiffness of left wrist, not elsewhere classified: Secondary | ICD-10-CM | POA: Diagnosis not present

## 2023-11-11 DIAGNOSIS — M25532 Pain in left wrist: Secondary | ICD-10-CM

## 2023-11-11 DIAGNOSIS — R531 Weakness: Secondary | ICD-10-CM | POA: Diagnosis not present

## 2023-11-11 DIAGNOSIS — Z9889 Other specified postprocedural states: Secondary | ICD-10-CM | POA: Diagnosis not present

## 2023-11-11 DIAGNOSIS — M25512 Pain in left shoulder: Secondary | ICD-10-CM | POA: Diagnosis not present

## 2023-11-11 DIAGNOSIS — M25642 Stiffness of left hand, not elsewhere classified: Secondary | ICD-10-CM

## 2023-11-11 DIAGNOSIS — M25612 Stiffness of left shoulder, not elsewhere classified: Secondary | ICD-10-CM | POA: Diagnosis not present

## 2023-11-11 DIAGNOSIS — R600 Localized edema: Secondary | ICD-10-CM

## 2023-11-11 NOTE — Therapy (Signed)
 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, estrogen receptor positive (HCC) 2019   a.) stage I RIGHT breast cancer (T1c N1 M0, ER/PR+ HER2/neu -); s/p systemic chemotherapy + XRT + endocrine therapy   Hashimoto's thyroiditis    Hypothyroidism    Long term current use of aromatase inhibitor    a.) anastrozole   Mixed hyperlipidemia    Multinodular goiter    Osteopenia    Personal history of chemotherapy 2019   Rt breast   Personal history of radiation therapy 2019   Rt breast   Raynaud's syndrome    Rheumatoid factor positive    Past Surgical History:  Procedure Laterality Date   BREAST BIOPSY Left    stereo-neg   BREAST BIOPSY Right 2019   BREAST LUMPECTOMY Right 2019   invasive ductal carcinoma   COLONOSCOPY     SHOULDER ARTHROSCOPY WITH SUBACROMIAL DECOMPRESSION, ROTATOR CUFF REPAIR AND BICEP TENDON REPAIR Left 08/17/2023   Procedure: SHOULDER ARTHROSCOPY WITH DEBRIDEMENT, DECOMPRESSION, ROTATOR CUFF REPAIR AND BICEPS TENODESIS.;  Surgeon: Christena Flake, MD;  Location: [facility] ORS;  Service: Orthopedics;  Laterality: Left;   Patient Active Problem List   Diagnosis Date Noted   Osteopenia 06/02/2023   Genetic testing 07/17/2022   ANA positive 10/30/2020   Rheumatoid factor positive 10/30/2020   Mixed hyperlipidemia 10/05/2020   Raynaud's syndrome 10/04/2020   Multinodular goiter  06/12/2020   Pharyngeal dysphagia 06/12/2020   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 in female, estrogen receptor positive (HCC) 07/28/2018    ONSET DATE: 08/17/23  REFERRING DIAG: L shoulder surgery with increase hand edema , pain and weakness  THERAPY DIAG:  Stiffness of left hand, not elsewhere classified  Localized edema  Stiffness of left wrist, not elsewhere classified  Pain in left wrist  Muscle weakness (generalized)  Rationale for Evaluation and Treatment: Rehabilitation  SUBJECTIVE:   SUBJECTIVE STATEMENT: SEE NOTE Pt accompanied by: self  PERTINENT HISTORY: [facility]gi visit note 10/01/23 Skin inspection of the left hand is notable for mild swelling, but otherwise is unremarkable. No erythema, ecchymosis, abrasions, or other skin abnormalities are identified. She has mild tenderness palpation along the ulnar aspect of her wrist, but there are no other areas of tenderness around the wrist or hand. She is able to actively flex extend all digits without any pain or triggering, but is unable to fully flex her thumb, index, and long fingers. Passively, each of the index and long MCP joints can be flexed to 90 degrees, while each of the index and long PIP joints can be flexed passively to 100 degrees. She is grossly neurovascular intact to all digits, other than some subjectively decreased sensation light touch to the thumb greater than index finger tips.  Assessment: Encounter Diagnoses  Name Primary?  Nontraumatic complete  tear of left rotator cuff Yes  Rotator cuff tendinitis, left  Tendinitis of upper biceps tendon of left shoulder   Plan: The treatment options were discussed with the patient and her husband. In addition, patient educational materials were provided regarding the diagnosis and treatment options. Regarding her left shoulder, the patient is pleased with her symptomatic and functional improvement at this time. I have recommended that she continue with  physical therapy and home exercises in order to optimize her range of motion, strength, and overall function.  Regarding her left hand/wrist symptoms, most likely the symptoms are due either to the position of her arm in her sling postoperatively or perhaps her nerve being irritated by the nerve block placed preoperative by the anesthesiologist. Regardless, the patient is advised that these symptoms most likely will resolve over time. Meanwhile, the patient will be sent to occupational therapy to work on range of motion and strength exercises to the hand in order to optimize her likelihood of regaining full function. She may progress in her activities as symptoms remain, but is to avoid offending activities. He may take over-the-counter medications as needed for discomfort. All of the patient's questions and concerns were   PRECAUTIONS: No weightbearing post shoulder surgery    WEIGHT BEARING RESTRICTIONS: Nonweightbearing left upper extremity  PAIN:  Are you having pain? 2/10 volar wrist left volar wrist  FALLS: Has patient fallen in last 6 months? Yes. Number of falls 1  LIVING ENVIRONMENT: Lives with: lives with their spouse   PLOF: Patient is retired but very active playing the [employer], working the yard, gardening, Archivist and crocheting  PATIENT GOALS: I want to be able to use my hand.  Make a fist get the numbness and swelling better.  NEXT MD VISIT: Next month  OBJECTIVE:  Note: Objective measures were completed at Evaluation unless otherwise noted.  HAND DOMINANCE: Right    FUNCTIONAL OUTCOME MEASURES: To be assessed next session  UPPER EXTREMITY ROM:     Active ROM Right eval Left eval L 10/26/23 L 10/29/23 L 11/11/23  Shoulder flexion       Shoulder abduction       Shoulder adduction       Shoulder extension       Shoulder internal rotation       Shoulder external rotation       Elbow flexion       Elbow extension       Wrist flexion  70 volar wrist pain 90 90  pain over dorsal wrist  90  Wrist extension  65 dorsal wrist pain 70 pull dorsal wrist 70 pull over dorsal wrist  75  Wrist ulnar deviation  30 volar wrist pain 40 pain volar wrist  40 40 pull volar wrist   Wrist radial deviation  20 volar wrist pain 25 volar wrist  25 25  Wrist pronation    90 90  Wrist supination    90 90 pain with resistance  (Blank rows = not tested)  Active ROM Right eval Left eval L 10/26/23 L 10/29/23 L 11/11/23  Thumb MCP (0-60)       Thumb IP (0-80)       Thumb Radial abd/add (0-55)  50  WNL    Thumb Palmar abd/add (0-45)   50 WN    Thumb Opposition to Small Finger  Sharp volar wrist pain with opposition to fifth 2nd fold pain volar wrist     Index MCP (0-90)  70  80 85  85  Index PIP (0-100)   90 95 95 100  Index DIP (0-70)         Long MCP (0-90)    70 85 85 90100  Long PIP (0-100)    85 95 100   Long DIP (0-70)         Ring MCP (0-90)    50 85 90 90  Ring PIP (0-100)    90 95 95 100  Ring DIP (0-70)         Little MCP (0-90)    40 90 90 90  Little PIP (0-100)    90 100 100 100  Little DIP (0-70)         (Blank rows = not tested)    HAND FUNCTION: 10/26/23 Grip strength: Right: NT lbs; Left: NT lbs, Lateral pinch: Right: 18 lbs, Left: 6 lbs, and 3 point pinch: Right: 17 lbs, Left: 5 lbs 10/29/23 Grip strength: Right: NT lbs; Left: NT lbs, Lateral pinch: Right: 18 lbs, Left: 8 lbs, and 3 point pinch: Right: 17 lbs, Left: 8 lbs 11/11/23 Grip strength: Right: 65 lbs; Left: 35 lbs, Lateral pinch: Right: 18 lbs, Left: 7 lbs, and 3 point pinch: Right: 17 lbs, Left:6 lbs pain volar wrist with prehension   COORDINATION: Decreased because of increased edema and stiffness and pain as well as numbness at eval 10/26/23 assess individual tendon glides as well as able to do different signs of digits.  Difficulty initiate as well as opposition from palm to fingertips difficulty mostly with fourth and fifth.  SENSATION: At eval patient report numbness in most of the thumb.   In the tips of second and third digits  EDEMA: Increase edema in the volar wrist as well as in the hand on the left  10/29/23 patient report increased fullness and heaviness in the upper arm on the left.  Since surgery. See lymphedema flowsheet for circumferences  COGNITION: Overall cognitive status: Within functional limits for tasks assessed   TREATMENT DATE: 11/11/23         Previous 2 sessions patient had increase edema in the left upper extremity including upper arm.   Patient reports unable to tolerate the glove at nighttime.  Was only wearing the Tubigrip from hand to elbow.   They state patient continued to have swelling over the volar wrist with a positive Tinel.   Do report numbness in the thumb and the fingertips improving.   Discussed with patient the importance of gradient compression tolerance on the whole left upper extremity.   Patient to continue with contrast followed by some Voltaren ointment on the volar wrist  Then switch her to trying to tolerate the compression glove with a Tubigrip D from hand to elbow and then Tubigrip F from mid forearm to axilla during the day 2 hours on and off while using her arm activating muscle pump.    Continue at home contrast at nighttime with Voltaren on the volar wrist and Tubigrip D from hand to elbow.   And will reassess next week.   Pt continues to have difficulty with tolerating the Isotoner glove Measurements taken see flowsheet.  Patient continues to make progress in digit flexion.  In thumb palmar radial abduction strengthening.  But increased pain with prehension strength at volar wrist. Patient with less pain with wrist flexion extension at volar wrist today and active range of motion within normal limits see flowsheet  Circumferential measurements: Upper arm 15 cm from elbow: Right 35.5 and left  36.5 Upper arm 10 cm from elbow: Right 32.4 and left 33.4 cm Elbow : Right 26 and left 26.5 cm 15 cm R 25.1: L 23.5 10 cm  R 21.4 L  20.4 Wrist R 16 cm , L 15.5 handR 20 cm ; L 19 cm                                                                             Modalities: Fluidotherapy Time: 8 Location: Left wrist and hand Performed to decrease pain, decreased stiffness and increase tissue mobility.  Done contrast with ice at the end followed by MLD by OT forearm, wrist hand and fingertips and then fingertips hand wrist to elbow.  Active range of motion tendon glides with composite fist to palm pain-free all. Patient to continue to focus on motion more than strength Active range of motion for thumb palmar radial abduction 10 reps  rubber band for resistance for palmar ,radial abduction and to increase 3 sets of 12 2 times a day Opposition with alternating digits pain-free and can slide down the fifth.  8 reps  Continue active range of motion for wrist flexion extension open hand pain-free Reviewed correctly doing supination pronation as well as radial ulnar deviation at her side with 1 pound weight 12 reps pain-free   PATIENT EDUCATION: Education details: findings of eval and HEP  Person educated: Patient Education method: Explanation, Demonstration, Tactile cues, Verbal cues, and Handouts Education comprehension: verbalized understanding, returned demonstration, verbal cues required, and needs further education  GOALS: Goals reviewed with patient? Yes  LONG TERM GOALS: Target date: 8 wks   Patient to be independent home program to increase digit flexion to touching palm without increase symptoms Baseline: Patient present with increased edema in hand and volar wrist with MC flexion 40 to 70 degrees and PIP 85-90 Goal status: INITIAL  2.  Left wrist active range of motion increased to within normal limits symptom-free for patient to use left hand in bathing and dressing Baseline: Left wrist extension 65 and flexion 70 degrees with increased pain in volar and dorsal forearm.  Radial ulnar deviation active range of  motion within normal limits but increased pain over volar wrist Goal status: INITIAL  3.  Thumb active range of motion all planes improved to within normal limits for patient to do buttons, open containers and use utensils and brush. Baseline: Thumb palmar radial abduction 50 degrees but increased tightness with pull.  Opposition to DIP of fifth with increased pain over volar wrist sharp pain Goal status: INITIAL  4.  Left grip and prehension strength improved to within functional limits for her age for patient to use hand and more than 50% of ADLs without increase symptoms Baseline: Patient with a increase edema and pain and decreased flexion of digits-grip and prehension not assessed patient 2 weeks out of sling.  Per patient PT initiating shoulder strength next week. Goal status: INITIAL  5.  L wrist and digits AROM increased to within normal limits to perform 75% of ADLs symptom-free Baseline: Decreased wrist and digit active range of motion, increased edema and pain using in ADLs less than 25% Goal status: INITIAL  ASSESSMENT:  CLINICAL IMPRESSION: Patient seen by  occupational therapy for left hand edema, numbness, pain and weakness.  Patient had left shoulder surgery with a nerve block on 08/17/2023.  Per patient immobilized until middle March.  Patient with increased edema mostly over volar wrist, carpal tunnel and hand.   Since evaluation patient is making great progress with edema and left hand and wrist as well as increased digits and wrist range of motion with decreased pain.  Slow but steady progress patient continues to have some increased edema over the volar wrist with increased tenderness with a positive Tinel's sign.  Limiting patient's strengthening as well as pain with prehension strength. -The last 2 sessions patient encouraged to wear Isotoner glove with a Tubigrip D and F for gradient compression on left upper extremity.  But appear patient cannot tolerate the compression glove  at nighttime.  Reviewed with patient to switch it to daytime use while muscle pump is working and can try her Voltaren ointment on the volar wrist if medically able to. .To facilitate some lymphatic flow and decrease edema in left upper extremity.  Patient to see orthopedics on Monday will reach out and see if patient can benefit from short steroid or anti-inflammatory.  Patient limited in functional use of left hand in ADLs and IADLs.  Patient can benefit from skilled OT services to decrease edema and pain and increase motion.  Patient to keep pain under 2/10 focusing on decreasing edema and pain and motion.  Will address strength after these factors.  Patient can benefit from skilled OT services to increase use of left hand in ADLs and IADLs to return to prior level of function.  PERFORMANCE DEFICITS: in functional skills including ADLs, IADLs, ROM, strength, pain, flexibility, decreased knowledge of use of DME, and UE functional use,   and psychosocial skills including environmental adaptation and routines and behaviors.   IMPAIRMENTS: are limiting patient from ADLs, IADLs, rest and sleep, play, leisure, and social participation.   COMORBIDITIES: has no other co-morbidities that affects occupational performance. Patient will benefit from skilled OT to address above impairments and improve overall function.  MODIFICATION OR ASSISTANCE TO COMPLETE EVALUATION: No modification of tasks or assist necessary to complete an evaluation.  OT OCCUPATIONAL PROFILE AND HISTORY: Problem focused assessment: Including review of records relating to presenting problem.  CLINICAL DECISION MAKING: LOW - limited treatment options, no task modification necessary  REHAB POTENTIAL: Good for goals  EVALUATION COMPLEXITY: Low   PLAN:  OT FREQUENCY: 1-2x/week  OT DURATION: 6 wks   PLANNED INTERVENTIONS: 97168 OT Re-evaluation, 97535 self care/ADL training, 09811 therapeutic exercise, 97530 therapeutic activity,  97112 neuromuscular re-education, 97140 manual therapy, 97018 paraffin, 91478 fluidotherapy, 97034 contrast bath, 97760 Splinting (initial encounter), passive range of motion, patient/family education, and DME and/or AE instructions  CONSULTED AND AGREED WITH PLAN OF CARE: Patient   Heloise Lobo, OTR/L,CLT 11/11/2023, 10:28 AM

## 2023-11-15 ENCOUNTER — Encounter: Admitting: Occupational Therapy

## 2023-11-16 DIAGNOSIS — M25512 Pain in left shoulder: Secondary | ICD-10-CM | POA: Diagnosis not present

## 2023-11-16 DIAGNOSIS — Z9889 Other specified postprocedural states: Secondary | ICD-10-CM | POA: Diagnosis not present

## 2023-11-18 ENCOUNTER — Ambulatory Visit: Admitting: Occupational Therapy

## 2023-11-18 DIAGNOSIS — M25532 Pain in left wrist: Secondary | ICD-10-CM | POA: Diagnosis not present

## 2023-11-18 DIAGNOSIS — R600 Localized edema: Secondary | ICD-10-CM | POA: Diagnosis not present

## 2023-11-18 DIAGNOSIS — M25642 Stiffness of left hand, not elsewhere classified: Secondary | ICD-10-CM | POA: Diagnosis not present

## 2023-11-18 DIAGNOSIS — Z9889 Other specified postprocedural states: Secondary | ICD-10-CM | POA: Diagnosis not present

## 2023-11-18 DIAGNOSIS — M6281 Muscle weakness (generalized): Secondary | ICD-10-CM

## 2023-11-18 DIAGNOSIS — M25632 Stiffness of left wrist, not elsewhere classified: Secondary | ICD-10-CM | POA: Diagnosis not present

## 2023-11-18 DIAGNOSIS — M25512 Pain in left shoulder: Secondary | ICD-10-CM | POA: Diagnosis not present

## 2023-11-18 NOTE — Therapy (Signed)
 OUTPATIENT OCCUPATIONAL THERAPY ORTHO TREATMENT  Patient Name: Alexa Knight MRN: [id number] DOB:20-Nov-1955, 68 y.o., female Today's Date: 11/18/2023  PCP: Dr Lourena Royal PROVIDER: Dr Daun Epstein  END OF SESSION:  OT End of Session - 11/18/23 1643     Visit Number 6    Number of Visits 8    Date for OT Re-Evaluation 11/29/23    OT Start Time 1530    OT Stop Time 1617    OT Time Calculation (min) 47 min    Activity Tolerance Patient tolerated treatment well    Behavior During Therapy [facility] for tasks assessed/performed             Past Medical History:  Diagnosis Date   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, estrogen receptor positive (HCC) 2019   a.) stage I RIGHT breast cancer (T1c N1 M0, ER/PR+ HER2/neu -); s/p systemic chemotherapy + XRT + endocrine therapy   Hashimoto's thyroiditis    Hypothyroidism    Long term current use of aromatase inhibitor    a.) anastrozole    Mixed hyperlipidemia    Multinodular goiter    Osteopenia    Personal history of chemotherapy 2019   Rt breast   Personal history of radiation therapy 2019   Rt breast   Raynaud's syndrome    Rheumatoid factor positive    Past Surgical History:  Procedure Laterality Date   BREAST BIOPSY Left    stereo-neg   BREAST BIOPSY Right 2019   BREAST LUMPECTOMY Right 2019   invasive ductal carcinoma   COLONOSCOPY     SHOULDER ARTHROSCOPY WITH SUBACROMIAL DECOMPRESSION, ROTATOR CUFF REPAIR AND BICEP TENDON REPAIR Left 08/17/2023   Procedure: SHOULDER ARTHROSCOPY WITH DEBRIDEMENT, DECOMPRESSION, ROTATOR CUFF REPAIR AND BICEPS TENODESIS.;  Surgeon: Elner Hahn, MD;  Location: [facility] ORS;  Service: Orthopedics;  Laterality: Left;   Patient Active Problem List   Diagnosis Date Noted   Osteopenia 06/02/2023   Genetic testing 07/17/2022   ANA positive 10/30/2020   Rheumatoid factor positive 10/30/2020   Mixed hyperlipidemia 10/05/2020   Raynaud's syndrome 10/04/2020   Multinodular goiter  06/12/2020   Pharyngeal dysphagia 06/12/2020   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 in female, estrogen receptor positive (HCC) 07/28/2018    ONSET DATE: 08/17/23  REFERRING DIAG: L shoulder surgery with increase hand edema , pain and weakness  THERAPY DIAG:  Stiffness of left hand, not elsewhere classified  Localized edema  Stiffness of left wrist, not elsewhere classified  Pain in left wrist  Muscle weakness (generalized)  Rationale for Evaluation and Treatment: Rehabilitation  SUBJECTIVE:   SUBJECTIVE STATEMENT: SEE NOTE Pt accompanied by: self  PERTINENT HISTORY: [facility] visit note 10/01/23 Skin inspection of the left hand is notable for mild swelling, but otherwise is unremarkable. No erythema, ecchymosis, abrasions, or other skin abnormalities are identified. She has mild tenderness palpation along the ulnar aspect of her wrist, but there are no other areas of tenderness around the wrist or hand. She is able to actively flex extend all digits without any pain or triggering, but is unable to fully flex her thumb, index, and long fingers. Passively, each of the index and long MCP joints can be flexed to 90 degrees, while each of the index and long PIP joints can be flexed passively to 100 degrees. She is grossly neurovascular intact to all digits, other than some subjectively decreased sensation light touch to the thumb greater than index finger tips.  Assessment: Encounter Diagnoses  Name Primary?  Nontraumatic complete  tear of left rotator cuff Yes  Rotator cuff tendinitis, left  Tendinitis of upper biceps tendon of left shoulder   Plan: The treatment options were discussed with the patient and her husband. In addition, patient educational materials were provided regarding the diagnosis and treatment options. Regarding her left shoulder, the patient is pleased with her symptomatic and functional improvement at this time. I have recommended that she continue with  physical therapy and home exercises in order to optimize her range of motion, strength, and overall function.  Regarding her left hand/wrist symptoms, most likely the symptoms are due either to the position of her arm in her sling postoperatively or perhaps her nerve being irritated by the nerve block placed preoperative by the anesthesiologist. Regardless, the patient is advised that these symptoms most likely will resolve over time. Meanwhile, the patient will be sent to occupational therapy to work on range of motion and strength exercises to the hand in order to optimize her likelihood of regaining full function. She may progress in her activities as symptoms remain, but is to avoid offending activities. He may take over-the-counter medications as needed for discomfort. All of the patient's questions and concerns were   PRECAUTIONS: No weightbearing post shoulder surgery    WEIGHT BEARING RESTRICTIONS: Nonweightbearing left upper extremity  PAIN:  Are you having pain?  No pain may be just shooting little pain in my thumb at times  FALLS: Has patient fallen in last 6 months? Yes. Number of falls 1  LIVING ENVIRONMENT: Lives with: lives with their spouse   PLOF: Patient is retired but very active playing the [employer], working the yard, gardening, Archivist and crocheting  PATIENT GOALS: I want to be able to use my hand.  Make a fist get the numbness and swelling better.  NEXT MD VISIT: Next month  OBJECTIVE:  Note: Objective measures were completed at Evaluation unless otherwise noted.  HAND DOMINANCE: Right    FUNCTIONAL OUTCOME MEASURES: To be assessed next session  UPPER EXTREMITY ROM:     Active ROM Right eval Left eval L 10/26/23 L 10/29/23 L 11/11/23 L 11/18/23  Shoulder flexion        Shoulder abduction        Shoulder adduction        Shoulder extension        Shoulder internal rotation        Shoulder external rotation        Elbow flexion        Elbow extension         Wrist flexion  70 volar wrist pain 90 90 pain over dorsal wrist  90 90  Wrist extension  65 dorsal wrist pain 70 pull dorsal wrist 70 pull over dorsal wrist  75 70  Wrist ulnar deviation  30 volar wrist pain 40 pain volar wrist  40 40 pull volar wrist  40  Wrist radial deviation  20 volar wrist pain 25 volar wrist  25 25 25   Wrist pronation    90 90 90  Wrist supination    90 90 pain with resistance 90  (Blank rows = not tested)  Active ROM Right eval Left eval L 10/26/23 L 10/29/23 L 11/11/23 L 11/18/23  Thumb MCP (0-60)        Thumb IP (0-80)      Little click per pt  Thumb Radial abd/add (0-55)  50  WNL   Pain  volar MC decrease   Thumb Palmar abd/add (0-45)  50 WN   WNL  Thumb Opposition to Small Finger  Sharp volar wrist pain with opposition to fifth 2nd fold pain volar wrist    Pain at volar thumb MC with opposition  Index MCP (0-90)  70  80 85 85   Index PIP (0-100)   90 95 95 100   Index DIP (0-70)          Long MCP (0-90)    70 85 85 90   Long PIP (0-100)    85 95 100 100   Long DIP (0-70)          Ring MCP (0-90)    50 85 90 90   Ring PIP (0-100)    90 95 95 100   Ring DIP (0-70)          Little MCP (0-90)    40 90 90 90   Little PIP (0-100)    90 100 100 100   Little DIP (0-70)          (Blank rows = not tested)    HAND FUNCTION: 10/26/23 Grip strength: Right: NT lbs; Left: NT lbs, Lateral pinch: Right: 18 lbs, Left: 6 lbs, and 3 point pinch: Right: 17 lbs, Left: 5 lbs 10/29/23 Grip strength: Right: NT lbs; Left: NT lbs, Lateral pinch: Right: 18 lbs, Left: 8 lbs, and 3 point pinch: Right: 17 lbs, Left: 8 lbs 11/11/23 Grip strength: Right: 65 lbs; Left: 42 lbs, Lateral pinch: Right: 18 lbs, Left: 13 lbs, and 3 point pinch: Right: 17 lbs, Left:10 lbs pain thumb volar MC with 3 point  COORDINATION: Decreased because of increased edema and stiffness and pain as well as numbness at eval 10/26/23 assess individual tendon glides as well as able to do different signs of  digits.  Difficulty initiate as well as opposition from palm to fingertips difficulty mostly with fourth and fifth.  SENSATION: At eval patient report numbness in most of the thumb.  In the tips of second and third digits  EDEMA: Increase edema in the volar wrist as well as in the hand on the left  10/29/23 patient report increased fullness and heaviness in the upper arm on the left.  Since surgery. See lymphedema flowsheet for circumferences  COGNITION: Overall cognitive status: Within functional limits for tasks assessed   TREATMENT DATE: 11/18/23         Patient arrived after being seen by orthopedics.   Patient to report was put on steroid.   She is on day 3 and doing much better.   Decreased swelling in the wrist and hand as well as decrease stiffness and pain.   Able to make a composite fist, pull up her pants and socks with no pain.  Could play her ukulele for 10 minutes this morning  Do report some clicking at the thumb.   Reports she was doing her massage into her upper arm and wearing her glove and compression in her left arm.  And can tell a difference.    Discussed with patient the importance of gradient compression tolerance on the whole left upper extremity.   Patient to continue with contrast in the morning. Patient reports she stopped it last 2 to 3 days  Encourage patient to continue at nighttime with gradient compression on the left upper extremity.   Measurements for digit and wrist range of motion as well as grip and prehension strength.  Great progress.  Taken see flowsheet.  Strength in wrist and forearm in  all planes 5 -/5 with no pain.  Circumferential measurements: Upper arm 15 cm from elbow: Right 35.5 and left 35 Upper arm 10 cm from elbow: Right 32.4 and left 32 cm Elbow : Right 26 and left 25.7 cm 15 cm R 25.1: L 24 10 cm  R 21.4 L 21 Wrist R 16 cm , L 15.5 handR 20 cm ; L 18.6 cm  Great progress in circumference of left upper extremity.                                                                             Modalities: Contrast time: 8 Location: Left wrist and hand Prior to review with patient home exercises for tendon glides as well as active range of motion for wrist in all planes.  As well as prior to soft tissue to volar thumb with review of home program   Active range of motion tendon glides with composite fist to palm pain-free all. Active range of motion for thumb palmar radial abduction 10 reps Soft tissue massage with Graston #6-4 brushing over volar thumb prior to light passive range of motion to thumb IP and MC. Reviewed with patient home exercise to prevent trigger thumb.     PATIENT EDUCATION: Education details: findings of eval and HEP  Person educated: Patient Education method: Explanation, Demonstration, Tactile cues, Verbal cues, and Handouts Education comprehension: verbalized understanding, returned demonstration, verbal cues required, and needs further education  GOALS: Goals reviewed with patient? Yes  LONG TERM GOALS: Target date: 8 wks   Patient to be independent home program to increase digit flexion to touching palm without increase symptoms Baseline: Patient present with increased edema in hand and volar wrist with MC flexion 40 to 70 degrees and PIP 85-90 Goal status: INITIAL  2.  Left wrist active range of motion increased to within normal limits symptom-free for patient to use left hand in bathing and dressing Baseline: Left wrist extension 65 and flexion 70 degrees with increased pain in volar and dorsal forearm.  Radial ulnar deviation active range of motion within normal limits but increased pain over volar wrist Goal status: INITIAL  3.  Thumb active range of motion all planes improved to within normal limits for patient to do buttons, open containers and use utensils and brush. Baseline: Thumb palmar radial abduction 50 degrees but increased tightness with pull.  Opposition to DIP of fifth  with increased pain over volar wrist sharp pain Goal status: INITIAL  4.  Left grip and prehension strength improved to within functional limits for her age for patient to use hand and more than 50% of ADLs without increase symptoms Baseline: Patient with a increase edema and pain and decreased flexion of digits-grip and prehension not assessed patient 2 weeks out of sling.  Per patient PT initiating shoulder strength next week. Goal status: INITIAL  5.  L wrist and digits AROM increased to within normal limits to perform 75% of ADLs symptom-free Baseline: Decreased wrist and digit active range of motion, increased edema and pain using in ADLs less than 25% Goal status: INITIAL  ASSESSMENT:  CLINICAL IMPRESSION: Patient seen by occupational therapy for left hand edema, numbness, pain and weakness.  Patient had  left shoulder surgery with a nerve block on 08/17/2023.  Per patient immobilized until middle March.  Patient with increased edema mostly over volar wrist, carpal tunnel and hand.   Since evaluation patient is making great progress with edema and left hand and wrist as well as increased digits and wrist range of motion with decreased pain.  This date patient did arrive after being on steroid for about 3 days.  After last orthopedic visit.  Patient with decrease edema, numbness, pain in the left hand and wrist.  Patient active range of motion in wrist and digit within normal limits.  Grip and prehension improved.  Patient do appear to have some tenderness over the thumb A1 pulley with increased pain and decreased radial abduction.  Patient do report a click with thumb flexion.  Reviewed with patient home program again especially for thumb.  Encouraged patient to continue with a home program starting with contrast and then AROM as well as compression of the left upper extremity and will monitor progress next week.  Patient limited in functional use of left hand in ADLs and IADLs.  Patient can  benefit from skilled OT services to decrease edema and pain and increase motion.  Patient to keep pain under 2/10 focusing on decreasing edema and pain and motion.  Will address strength after these factors.  Patient can benefit from skilled OT services to increase use of left hand in ADLs and IADLs to return to prior level of function.  PERFORMANCE DEFICITS: in functional skills including ADLs, IADLs, ROM, strength, pain, flexibility, decreased knowledge of use of DME, and UE functional use,   and psychosocial skills including environmental adaptation and routines and behaviors.   IMPAIRMENTS: are limiting patient from ADLs, IADLs, rest and sleep, play, leisure, and social participation.   COMORBIDITIES: has no other co-morbidities that affects occupational performance. Patient will benefit from skilled OT to address above impairments and improve overall function.  MODIFICATION OR ASSISTANCE TO COMPLETE EVALUATION: No modification of tasks or assist necessary to complete an evaluation.  OT OCCUPATIONAL PROFILE AND HISTORY: Problem focused assessment: Including review of records relating to presenting problem.  CLINICAL DECISION MAKING: LOW - limited treatment options, no task modification necessary  REHAB POTENTIAL: Good for goals  EVALUATION COMPLEXITY: Low   PLAN:  OT FREQUENCY: 1-2x/week  OT DURATION: 6 wks   PLANNED INTERVENTIONS: 97168 OT Re-evaluation, 97535 self care/ADL training, 40981 therapeutic exercise, 97530 therapeutic activity, 97112 neuromuscular re-education, 97140 manual therapy, 97018 paraffin, 19147 fluidotherapy, 97034 contrast bath, 97760 Splinting (initial encounter), passive range of motion, patient/family education, and DME and/or AE instructions  CONSULTED AND AGREED WITH PLAN OF CARE: Patient   Heloise Lobo, OTR/L,CLT 11/18/2023, 4:45 PM

## 2023-11-22 ENCOUNTER — Ambulatory Visit: Admitting: Occupational Therapy

## 2023-11-22 DIAGNOSIS — Z9889 Other specified postprocedural states: Secondary | ICD-10-CM | POA: Diagnosis not present

## 2023-11-22 DIAGNOSIS — M25532 Pain in left wrist: Secondary | ICD-10-CM | POA: Diagnosis not present

## 2023-11-22 DIAGNOSIS — R600 Localized edema: Secondary | ICD-10-CM

## 2023-11-22 DIAGNOSIS — M25632 Stiffness of left wrist, not elsewhere classified: Secondary | ICD-10-CM

## 2023-11-22 DIAGNOSIS — R531 Weakness: Secondary | ICD-10-CM | POA: Diagnosis not present

## 2023-11-22 DIAGNOSIS — M25642 Stiffness of left hand, not elsewhere classified: Secondary | ICD-10-CM | POA: Diagnosis not present

## 2023-11-22 DIAGNOSIS — M25512 Pain in left shoulder: Secondary | ICD-10-CM | POA: Diagnosis not present

## 2023-11-22 DIAGNOSIS — M6281 Muscle weakness (generalized): Secondary | ICD-10-CM

## 2023-11-22 DIAGNOSIS — M25612 Stiffness of left shoulder, not elsewhere classified: Secondary | ICD-10-CM | POA: Diagnosis not present

## 2023-11-22 NOTE — Therapy (Signed)
 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, estrogen receptor positive (HCC) 2019   a.) stage I RIGHT breast cancer (T1c N1 M0, ER/PR+ HER2/neu -); s/p systemic chemotherapy + XRT + endocrine therapy   Hashimoto's thyroiditis    Hypothyroidism    Long term current use of aromatase inhibitor    a.) anastrozole    Mixed hyperlipidemia    Multinodular goiter    Osteopenia    Personal history of chemotherapy 2019   Rt breast   Personal history of radiation therapy 2019   Rt breast   Raynaud's syndrome    Rheumatoid factor positive    Past Surgical History:  Procedure Laterality Date   BREAST BIOPSY Left    stereo-neg   BREAST BIOPSY Right 2019   BREAST LUMPECTOMY Right 2019   invasive ductal carcinoma   COLONOSCOPY     SHOULDER ARTHROSCOPY WITH SUBACROMIAL DECOMPRESSION, ROTATOR CUFF REPAIR AND BICEP TENDON REPAIR Left 08/17/2023   Procedure: SHOULDER ARTHROSCOPY WITH DEBRIDEMENT, DECOMPRESSION, ROTATOR CUFF REPAIR AND BICEPS TENODESIS.;  Surgeon: Elner Hahn, MD;  Location: [facility];  Service: Orthopedics;  Laterality: Left;   Patient Active Problem List   Diagnosis Date Noted   Osteopenia 06/02/2023   Genetic testing 07/17/2022   ANA positive 10/30/2020   Rheumatoid factor positive 10/30/2020   Mixed hyperlipidemia 10/05/2020   Raynaud's syndrome 10/04/2020   Multinodular goiter  06/12/2020   Pharyngeal dysphagia 06/12/2020   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 in female, estrogen receptor positive (HCC) 07/28/2018    ONSET DATE: 08/17/23  REFERRING DIAG: L shoulder surgery with increase hand edema , pain and weakness  THERAPY DIAG:  Stiffness of left hand, not elsewhere classified  Localized edema  Stiffness of left wrist, not elsewhere classified  Pain in left wrist  Muscle weakness (generalized)  Rationale for Evaluation and Treatment: Rehabilitation  SUBJECTIVE:   SUBJECTIVE STATEMENT: SEE NOTE Pt accompanied by: self  PERTINENT HISTORY: [facility] visit note 10/01/23 Skin inspection of the left hand is notable for mild swelling, but otherwise is unremarkable. No erythema, ecchymosis, abrasions, or other skin abnormalities are identified. She has mild tenderness palpation along the ulnar aspect of her wrist, but there are no other areas of tenderness around the wrist or hand. She is able to actively flex extend all digits without any pain or triggering, but is unable to fully flex her thumb, index, and long fingers. Passively, each of the index and long MCP joints can be flexed to 90 degrees, while each of the index and long PIP joints can be flexed passively to 100 degrees. She is grossly neurovascular intact to all digits, other than some subjectively decreased sensation light touch to the thumb greater than index finger tips.  Assessment: Encounter Diagnoses  Name Primary?  Nontraumatic complete  tear of left rotator cuff Yes  Rotator cuff tendinitis, left  Tendinitis of upper biceps tendon of left shoulder   Plan: The treatment options were discussed with the patient and her husband. In addition, patient educational materials were provided regarding the diagnosis and treatment options. Regarding her left shoulder, the patient is pleased with her symptomatic and functional improvement at this time. I have recommended that she continue with  physical therapy and home exercises in order to optimize her range of motion, strength, and overall function.  Regarding her left hand/wrist symptoms, most likely the symptoms are due either to the position of her arm in her sling postoperatively or perhaps her nerve being irritated by the nerve block placed preoperative by the anesthesiologist. Regardless, the patient is advised that these symptoms most likely will resolve over time. Meanwhile, the patient will be sent to occupational therapy to work on range of motion and strength exercises to the hand in order to optimize her likelihood of regaining full function. She may progress in her activities as symptoms remain, but is to avoid offending activities. He may take over-the-counter medications as needed for discomfort. All of the patient's questions and concerns were   PRECAUTIONS: No weightbearing post shoulder surgery    WEIGHT BEARING RESTRICTIONS: Nonweightbearing left upper extremity  PAIN:  Are you having pain?  No pain may be just shooting little pain in my thumb at times  FALLS: Has patient fallen in last 6 months? Yes. Number of falls 1  LIVING ENVIRONMENT: Lives with: lives with their spouse   PLOF: Patient is retired but very active playing the [employer], working the yard, gardening, Archivist and crocheting  PATIENT GOALS: I want to be able to use my hand.  Make a fist get the numbness and swelling better.  NEXT MD VISIT: Next month  OBJECTIVE:  Note: Objective measures were completed at Evaluation unless otherwise noted.  HAND DOMINANCE: Right    FUNCTIONAL OUTCOME MEASURES: To be assessed next session  UPPER EXTREMITY ROM:     Active ROM Right eval Left eval L 10/26/23 L 10/29/23 L 11/11/23 L 11/18/23  Shoulder flexion        Shoulder abduction        Shoulder adduction        Shoulder extension        Shoulder internal rotation        Shoulder external rotation        Elbow flexion        Elbow extension         Wrist flexion  70 volar wrist pain 90 90 pain over dorsal wrist  90 90  Wrist extension  65 dorsal wrist pain 70 pull dorsal wrist 70 pull over dorsal wrist  75 70  Wrist ulnar deviation  30 volar wrist pain 40 pain volar wrist  40 40 pull volar wrist  40  Wrist radial deviation  20 volar wrist pain 25 volar wrist  25 25 25   Wrist pronation    90 90 90  Wrist supination    90 90 pain with resistance 90  (Blank rows = not tested)  Active ROM Right eval Left eval L 10/26/23 L 10/29/23 L 11/11/23 L 11/18/23 L 11/22/23  Thumb MCP (0-60)       60  Thumb IP (0-80)      Little click per pt 45 R 75  Thumb Radial abd/add (0-55)  50  WNL   Pain  volar MC decrease  Pain  volar MC decrease   Thumb Palmar abd/add (0-45)   50 WN   WNL WNL  Thumb Opposition to Small Finger  Sharp volar wrist pain with opposition to fifth 2nd fold pain volar wrist    Pain at volar thumb MC with opposition   Index MCP (0-90)  70  80 85 85    Index PIP (0-100)   90 95 95 100    Index DIP (0-70)           Long MCP (0-90)    70 85 85 90    Long PIP (0-100)    85 95 100 100    Long DIP (0-70)           Ring MCP (0-90)    50 85 90 90    Ring PIP (0-100)    90 95 95 100    Ring DIP (0-70)           Little MCP (0-90)    40 90 90 90    Little PIP (0-100)    90 100 100 100    Little DIP (0-70)           (Blank rows = not tested)    HAND FUNCTION: 10/26/23 Grip strength: Right: NT lbs; Left: NT lbs, Lateral pinch: Right: 18 lbs, Left: 6 lbs, and 3 point pinch: Right: 17 lbs, Left: 5 lbs 10/29/23 Grip strength: Right: NT lbs; Left: NT lbs, Lateral pinch: Right: 18 lbs, Left: 8 lbs, and 3 point pinch: Right: 17 lbs, Left: 8 lbs 11/11/23 Grip strength: Right: 65 lbs; Left: 42 lbs, Lateral pinch: Right: 18 lbs, Left: 13 lbs, and 3 point pinch: Right: 17 lbs, Left:10 lbs pain thumb volar MC with 3 point  COORDINATION: Decreased because of increased edema and stiffness and pain as well as numbness at eval 10/26/23 assess  individual tendon glides as well as able to do different signs of digits.  Difficulty initiate as well as opposition from palm to fingertips difficulty mostly with fourth and fifth.  SENSATION: At eval patient report numbness in most of the thumb.  In the tips of second and third digits  EDEMA: Increase edema in the volar wrist as well as in the hand on the left  10/29/23 patient report increased fullness and heaviness in the upper arm on the left.  Since surgery. See lymphedema flowsheet for circumferences  COGNITION: Overall cognitive status: Within functional limits for tasks assessed   TREATMENT DATE: 11/22/23        I finished the steroid this past Saturday. The tip of my thumb still numb.  But I feel like my other fingers little bit better. The tenderness over my wrist is little bit back. I can still make a good fist.  But my index and middle finger feels fuller  Patient finished her steroid over the weekend. Decreased swelling in the wrist and hand still compared to last week but elbow and upper arm increased somewhat.   Patient Filho compression bother in her hand. Discharged at this time her compression glove and Tubigrip's And reviewed with her manual lymph drainage-stimulating left axillary lymph nodes and then changing it to more doing proximal clearing of upper arm, followed by dorsal forearm over upper arm and then volar forearm over upper arm 20 reps each in supine to 3 times a day  Able to make a composite fist, but little tighter feeling in the index and third digit Do report some clicking at  the thumb IP still   Patient to continue with contrast in the morning.    Measurements for left upper extremity   Strength in wrist and forearm in all planes 5 -/5 with no pain.  Circumferential measurements: Upper arm 15 cm from elbow: Right 35.5 and left 36 Upper arm 10 cm from elbow: Right 32.4 and left 32.5 cm Elbow : Right 26 and left 26 cm 15 cm R 25.1: L 24.2 10 cm   R 21.4 L 20.5 Wrist R 16 cm , L 15.5 handR 20 cm ; L 19 cm  Circumference somewhat up compared to last week especially in the upper arm.                                                                             Active range of motion tendon glides with composite fist to palm pain-free all. Active range of motion for thumb palmar radial abduction 10 reps Block passive range of motion to thumb IP prior to opposition Wrist extension.  Stay away from wrist flexion After contrast    PATIENT EDUCATION: Education details: findings of eval and HEP  Person educated: Patient Education method: Explanation, Demonstration, Tactile cues, Verbal cues, and Handouts Education comprehension: verbalized understanding, returned demonstration, verbal cues required, and needs further education  GOALS: Goals reviewed with patient? Yes  LONG TERM GOALS: Target date: 8 wks   Patient to be independent home program to increase digit flexion to touching palm without increase symptoms Baseline: Patient present with increased edema in hand and volar wrist with MC flexion 40 to 70 degrees and PIP 85-90 Goal status: INITIAL  2.  Left wrist active range of motion increased to within normal limits symptom-free for patient to use left hand in bathing and dressing Baseline: Left wrist extension 65 and flexion 70 degrees with increased pain in volar and dorsal forearm.  Radial ulnar deviation active range of motion within normal limits but increased pain over volar wrist Goal status: INITIAL  3.  Thumb active range of motion all planes improved to within normal limits for patient to do buttons, open containers and use utensils and brush. Baseline: Thumb palmar radial abduction 50 degrees but increased tightness with pull.  Opposition to DIP of fifth with increased pain over volar wrist sharp pain Goal status: INITIAL  4.  Left grip and prehension strength improved to within functional limits for her age for patient  to use hand and more than 50% of ADLs without increase symptoms Baseline: Patient with a increase edema and pain and decreased flexion of digits-grip and prehension not assessed patient 2 weeks out of sling.  Per patient PT initiating shoulder strength next week. Goal status: INITIAL  5.  L wrist and digits AROM increased to within normal limits to perform 75% of ADLs symptom-free Baseline: Decreased wrist and digit active range of motion, increased edema and pain using in ADLs less than 25% Goal status: INITIAL  ASSESSMENT:  CLINICAL IMPRESSION: Patient seen by occupational therapy for left hand edema, numbness, pain and weakness.  Patient had left shoulder surgery with a nerve block on 08/17/2023.  Per patient immobilized until middle March.  Patient with increased edema mostly over volar wrist,  carpal tunnel and hand.   Since evaluation patient is making great progress with edema and left hand and wrist as well as increased digits and wrist range of motion with decreased pain.  End of last week after being on her steroid patient showed decrease edema, numbness, pain in the left hand and wrist.  Patient active range of motion in wrist and digit within normal limits.  Grip and prehension improved.  But patient appears today after finishing up her steroid over the weekend with continuous clicking in the thumb IP.  But less tenderness over the thumb A1 pulley, as well as decreased numbness in the index and middle.  Patient do report for feeling in the 2nd and 3rd digit.  Continues numbness in the thumb IP patient.  To have positive Tinel again in the volar wrist.  And her circumference in upper extremity in the upper arm increased compared to last week.  Reviewed and change patient's home program taking a weight gradient compression in the left upper extremity but went over with her MLD.  Patient to focus on active range of motion taking away any wrist flexion as well as weight.  Patient limited in  functional use of left hand in ADLs and IADLs.  Patient can benefit from skilled OT services to decrease edema and pain and increase motion.  Patient to keep pain under 2/10 focusing on decreasing edema and pain and motion.  Will address strength after these factors.  Patient can benefit from skilled OT services to increase use of left hand in ADLs and IADLs to return to prior level of function.  PERFORMANCE DEFICITS: in functional skills including ADLs, IADLs, ROM, strength, pain, flexibility, decreased knowledge of use of DME, and UE functional use,   and psychosocial skills including environmental adaptation and routines and behaviors.   IMPAIRMENTS: are limiting patient from ADLs, IADLs, rest and sleep, play, leisure, and social participation.   COMORBIDITIES: has no other co-morbidities that affects occupational performance. Patient will benefit from skilled OT to address above impairments and improve overall function.  MODIFICATION OR ASSISTANCE TO COMPLETE EVALUATION: No modification of tasks or assist necessary to complete an evaluation.  OT OCCUPATIONAL PROFILE AND HISTORY: Problem focused assessment: Including review of records relating to presenting problem.  CLINICAL DECISION MAKING: LOW - limited treatment options, no task modification necessary  REHAB POTENTIAL: Good for goals  EVALUATION COMPLEXITY: Low   PLAN:  OT FREQUENCY: 1-2x/week  OT DURATION: 6 wks   PLANNED INTERVENTIONS: 97168 OT Re-evaluation, 97535 self care/ADL training, 82956 therapeutic exercise, 97530 therapeutic activity, 97112 neuromuscular re-education, 97140 manual therapy, 97018 paraffin, 21308 fluidotherapy, 97034 contrast bath, 97760 Splinting (initial encounter), passive range of motion, patient/family education, and DME and/or AE instructions  CONSULTED AND AGREED WITH PLAN OF CARE: Patient   Heloise Lobo, OTR/L,CLT 11/22/2023, 11:57 AM

## 2023-11-24 DIAGNOSIS — K080 Exfoliation of teeth due to systemic causes: Secondary | ICD-10-CM | POA: Diagnosis not present

## 2023-11-25 DIAGNOSIS — M25612 Stiffness of left shoulder, not elsewhere classified: Secondary | ICD-10-CM | POA: Diagnosis not present

## 2023-11-25 DIAGNOSIS — M25512 Pain in left shoulder: Secondary | ICD-10-CM | POA: Diagnosis not present

## 2023-11-25 DIAGNOSIS — R531 Weakness: Secondary | ICD-10-CM | POA: Diagnosis not present

## 2023-11-25 DIAGNOSIS — Z9889 Other specified postprocedural states: Secondary | ICD-10-CM | POA: Diagnosis not present

## 2023-11-29 ENCOUNTER — Ambulatory Visit: Attending: Surgery | Admitting: Occupational Therapy

## 2023-11-29 DIAGNOSIS — R531 Weakness: Secondary | ICD-10-CM | POA: Diagnosis not present

## 2023-11-29 DIAGNOSIS — Z9889 Other specified postprocedural states: Secondary | ICD-10-CM | POA: Diagnosis not present

## 2023-11-29 DIAGNOSIS — M25642 Stiffness of left hand, not elsewhere classified: Secondary | ICD-10-CM | POA: Diagnosis not present

## 2023-11-29 DIAGNOSIS — M25532 Pain in left wrist: Secondary | ICD-10-CM | POA: Diagnosis not present

## 2023-11-29 DIAGNOSIS — M25512 Pain in left shoulder: Secondary | ICD-10-CM | POA: Diagnosis not present

## 2023-11-29 DIAGNOSIS — M25612 Stiffness of left shoulder, not elsewhere classified: Secondary | ICD-10-CM | POA: Diagnosis not present

## 2023-11-29 DIAGNOSIS — M25632 Stiffness of left wrist, not elsewhere classified: Secondary | ICD-10-CM

## 2023-11-29 DIAGNOSIS — R600 Localized edema: Secondary | ICD-10-CM | POA: Diagnosis not present

## 2023-11-29 DIAGNOSIS — M6281 Muscle weakness (generalized): Secondary | ICD-10-CM

## 2023-11-30 ENCOUNTER — Encounter: Payer: Self-pay | Admitting: Occupational Therapy

## 2023-11-30 NOTE — Therapy (Signed)
 OUTPATIENT OCCUPATIONAL THERAPY ORTHO TREATMENT  Patient Name: Alexa Knight MRN: [id number] DOB:07-17-1956, 68 y.o., female Today's Date: 11/30/2023  PCP: Dr Lourena Royal PROVIDER: Dr Daun Epstein  END OF SESSION:  OT End of Session - 11/30/23 1605     Visit Number 8    Number of Visits 8    Date for OT Re-Evaluation 11/29/23    OT Start Time 0948    OT Stop Time 1030    OT Time Calculation (min) 42 min    Activity Tolerance Patient tolerated treatment well    Behavior During Therapy [facility] for tasks assessed/performed             Past Medical History:  Diagnosis Date   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, estrogen receptor positive (HCC) 2019   a.) stage I RIGHT breast cancer (T1c N1 M0, ER/PR+ HER2/neu -); s/p systemic chemotherapy + XRT + endocrine therapy   Hashimoto's thyroiditis    Hypothyroidism    Long term current use of aromatase inhibitor    a.) anastrozole    Mixed hyperlipidemia    Multinodular goiter    Osteopenia    Personal history of chemotherapy 2019   Rt breast   Personal history of radiation therapy 2019   Rt breast   Raynaud's syndrome    Rheumatoid factor positive    Past Surgical History:  Procedure Laterality Date   BREAST BIOPSY Left    stereo-neg   BREAST BIOPSY Right 2019   BREAST LUMPECTOMY Right 2019   invasive ductal carcinoma   COLONOSCOPY     SHOULDER ARTHROSCOPY WITH SUBACROMIAL DECOMPRESSION, ROTATOR CUFF REPAIR AND BICEP TENDON REPAIR Left 08/17/2023   Procedure: SHOULDER ARTHROSCOPY WITH DEBRIDEMENT, DECOMPRESSION, ROTATOR CUFF REPAIR AND BICEPS TENODESIS.;  Surgeon: Elner Hahn, MD;  Location: [facility] ORS;  Service: Orthopedics;  Laterality: Left;   Patient Active Problem List   Diagnosis Date Noted   Osteopenia 06/02/2023   Genetic testing 07/17/2022   ANA positive 10/30/2020   Rheumatoid factor positive 10/30/2020   Mixed hyperlipidemia 10/05/2020   Raynaud's syndrome 10/04/2020   Multinodular goiter  06/12/2020   Pharyngeal dysphagia 06/12/2020   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 in female, estrogen receptor positive (HCC) 07/28/2018    ONSET DATE: 08/17/23  REFERRING DIAG: L shoulder surgery with increase hand edema , pain and weakness  THERAPY DIAG:  Stiffness of left hand, not elsewhere classified  Stiffness of left wrist, not elsewhere classified  Localized edema  Pain in left wrist  Muscle weakness (generalized)  Rationale for Evaluation and Treatment: Rehabilitation  SUBJECTIVE:   SUBJECTIVE STATEMENT: SEE NOTE Pt accompanied by: self  PERTINENT HISTORY: [facility] visit note 10/01/23 Skin inspection of the left hand is notable for mild swelling, but otherwise is unremarkable. No erythema, ecchymosis, abrasions, or other skin abnormalities are identified. She has mild tenderness palpation along the ulnar aspect of her wrist, but there are no other areas of tenderness around the wrist or hand. She is able to actively flex extend all digits without any pain or triggering, but is unable to fully flex her thumb, index, and long fingers. Passively, each of the index and long MCP joints can be flexed to 90 degrees, while each of the index and long PIP joints can be flexed passively to 100 degrees. She is grossly neurovascular intact to all digits, other than some subjectively decreased sensation light touch to the thumb greater than index finger tips.  Assessment: Encounter Diagnoses  Name Primary?  Nontraumatic complete  tear of left rotator cuff Yes  Rotator cuff tendinitis, left  Tendinitis of upper biceps tendon of left shoulder   Plan: The treatment options were discussed with the patient and her husband. In addition, patient educational materials were provided regarding the diagnosis and treatment options. Regarding her left shoulder, the patient is pleased with her symptomatic and functional improvement at this time. I have recommended that she continue with  physical therapy and home exercises in order to optimize her range of motion, strength, and overall function.  Regarding her left hand/wrist symptoms, most likely the symptoms are due either to the position of her arm in her sling postoperatively or perhaps her nerve being irritated by the nerve block placed preoperative by the anesthesiologist. Regardless, the patient is advised that these symptoms most likely will resolve over time. Meanwhile, the patient will be sent to occupational therapy to work on range of motion and strength exercises to the hand in order to optimize her likelihood of regaining full function. She may progress in her activities as symptoms remain, but is to avoid offending activities. He may take over-the-counter medications as needed for discomfort. All of the patient's questions and concerns were   PRECAUTIONS: No weightbearing post shoulder surgery    WEIGHT BEARING RESTRICTIONS: Nonweightbearing left upper extremity  PAIN:  Are you having pain?  No pain may be just shooting little pain in my thumb at times  FALLS: Has patient fallen in last 6 months? Yes. Number of falls 1  LIVING ENVIRONMENT: Lives with: lives with their spouse   PLOF: Patient is retired but very active playing the [employer], working the yard, gardening, Archivist and crocheting  PATIENT GOALS: I want to be able to use my hand.  Make a fist get the numbness and swelling better.  NEXT MD VISIT: Next month  OBJECTIVE:  Note: Objective measures were completed at Evaluation unless otherwise noted.  HAND DOMINANCE: Right  FUNCTIONAL OUTCOME MEASURES: To be assessed next session  UPPER EXTREMITY ROM:     Active ROM Right eval Left eval L 10/26/23 L 10/29/23 L 11/11/23 L 11/18/23  Shoulder flexion        Shoulder abduction        Shoulder adduction        Shoulder extension        Shoulder internal rotation        Shoulder external rotation        Elbow flexion        Elbow extension         Wrist flexion  70 volar wrist pain 90 90 pain over dorsal wrist  90 90  Wrist extension  65 dorsal wrist pain 70 pull dorsal wrist 70 pull over dorsal wrist  75 70  Wrist ulnar deviation  30 volar wrist pain 40 pain volar wrist  40 40 pull volar wrist  40  Wrist radial deviation  20 volar wrist pain 25 volar wrist  25 25 25   Wrist pronation    90 90 90  Wrist supination    90 90 pain with resistance 90  (Blank rows = not tested)  Active ROM Right eval Left eval L 10/26/23 L 10/29/23 L 11/11/23 L 11/18/23 L 11/22/23  Thumb MCP (0-60)       60  Thumb IP (0-80)      Little click per pt 45 R 75  Thumb Radial abd/add (0-55)  50  WNL   Pain  volar MC decrease  Pain  volar MC decrease   Thumb Palmar abd/add (0-45)   50 WN   WNL WNL  Thumb Opposition to Small Finger  Sharp volar wrist pain with opposition to fifth 2nd fold pain volar wrist    Pain at volar thumb MC with opposition   Index MCP (0-90)  70  80 85 85    Index PIP (0-100)   90 95 95 100    Index DIP (0-70)           Long MCP (0-90)    70 85 85 90    Long PIP (0-100)    85 95 100 100    Long DIP (0-70)           Ring MCP (0-90)    50 85 90 90    Ring PIP (0-100)    90 95 95 100    Ring DIP (0-70)           Little MCP (0-90)    40 90 90 90    Little PIP (0-100)    90 100 100 100    Little DIP (0-70)           (Blank rows = not tested)    HAND FUNCTION: 10/26/23 Grip strength: Right: NT lbs; Left: NT lbs, Lateral pinch: Right: 18 lbs, Left: 6 lbs, and 3 point pinch: Right: 17 lbs, Left: 5 lbs 10/29/23 Grip strength: Right: NT lbs; Left: NT lbs, Lateral pinch: Right: 18 lbs, Left: 8 lbs, and 3 point pinch: Right: 17 lbs, Left: 8 lbs 11/11/23 Grip strength: Right: 65 lbs; Left: 42 lbs, Lateral pinch: Right: 18 lbs, Left: 13 lbs, and 3 point pinch: Right: 17 lbs, Left:10 lbs pain thumb volar MC with 3 point  COORDINATION: Decreased because of increased edema and stiffness and pain as well as numbness at eval 10/26/23 assess individual  tendon glides as well as able to do different signs of digits.  Difficulty initiate as well as opposition from palm to fingertips difficulty mostly with fourth and fifth.  SENSATION: At eval patient report numbness in most of the thumb.  In the tips of second and third digits  EDEMA: Increase edema in the volar wrist as well as in the hand on the left  10/29/23 patient report increased fullness and heaviness in the upper arm on the left.  Since surgery. See lymphedema flowsheet for circumferences  COGNITION: Overall cognitive status: Within functional limits for tasks assessed   TREATMENT DATE: 11/29/23       Pt reports she felt good after taking the steroid, finishing up last week.  She still feels the tip of her thumb is numb.  She has not received a call yet with the date of her nerve conduction study yet.  She reports she stopped using compression since has session.  She tried to do MLD at home but it was really difficult to hold her arm up in supine during attempts.  "I can't do it with my shoulder."   Manual Therapy/MLD: Reinstruction on manual lymph drainage modified and performed in sitting with arm supported onto table during application of MLD.  Stimulation of neck, left axillary lymph nodes, proximal clearing of upper arm, followed by dorsal forearm over upper arm and then volar forearm over upper arm 20 reps each in sitting.  Pt able to demonstrate a composite fist, but little tighter feeling in the index and third digit but improving   Patient to continue with contrast in the morning. Measurements  for left upper extremity   Strength in wrist and forearm in all planes 5 -/5 with no pain.  Circumferential measurements: Upper arm 15 cm from elbow: Right 35.5 and left 34 Upper arm 10 cm from elbow: Right 32.4 and left 32 cm Elbow : Right 26 and left 26.5 cm 15 cm R 25.1: L 23.7 10 cm  R 21.4 L 20.5 Wrist R 16 cm , L 15.5 Hand R 20 cm ; L 19 cm  Decreases noted this date in  forearm and upper arm, elbow slightly up by  .5 cm                                                                             Therapeutic Exercises:  Active range of motion tendon glides with composite fist to palm pain-free all. Active range of motion for thumb palmar radial abduction 10 reps Block passive range of motion to thumb IP prior to opposition Continue to focus on wrist extension    PATIENT EDUCATION: Education details: findings of eval and HEP  Person educated: Patient Education method: Explanation, Demonstration, Tactile cues, Verbal cues, and Handouts Education comprehension: verbalized understanding, returned demonstration, verbal cues required, and needs further education  GOALS: Goals reviewed with patient? Yes  LONG TERM GOALS: Target date: 8 wks   Patient to be independent home program to increase digit flexion to touching palm without increase symptoms Baseline: Patient present with increased edema in hand and volar wrist with MC flexion 40 to 70 degrees and PIP 85-90 Goal status: INITIAL  2.  Left wrist active range of motion increased to within normal limits symptom-free for patient to use left hand in bathing and dressing Baseline: Left wrist extension 65 and flexion 70 degrees with increased pain in volar and dorsal forearm.  Radial ulnar deviation active range of motion within normal limits but increased pain over volar wrist Goal status: INITIAL  3.  Thumb active range of motion all planes improved to within normal limits for patient to do buttons, open containers and use utensils and brush. Baseline: Thumb palmar radial abduction 50 degrees but increased tightness with pull.  Opposition to DIP of fifth with increased pain over volar wrist sharp pain Goal status: INITIAL  4.  Left grip and prehension strength improved to within functional limits for her age for patient to use hand and more than 50% of ADLs without increase symptoms Baseline: Patient with a  increase edema and pain and decreased flexion of digits-grip and prehension not assessed patient 2 weeks out of sling.  Per patient PT initiating shoulder strength next week. Goal status: INITIAL  5.  L wrist and digits AROM increased to within normal limits to perform 75% of ADLs symptom-free Baseline: Decreased wrist and digit active range of motion, increased edema and pain using in ADLs less than 25% Goal status: INITIAL  ASSESSMENT:  CLINICAL IMPRESSION: Patient seen by occupational therapy for left hand edema, numbness, pain and weakness.  Patient had left shoulder surgery with a nerve block on 08/17/2023.  Per patient immobilized until middle March.  Patient with increased edema mostly over volar wrist, carpal tunnel and hand.   Since evaluation patient is making great progress with  edema and left hand and wrist as well as increased digits and wrist range of motion with decreased pain.  End of last week after being on her steroid patient showed decrease edema, numbness, pain in the left hand and wrist.  Patient active range of motion in wrist and digit within normal limits.  Grip and prehension improved.  But patient appears today after finishing up her steroid over the weekend with occasional clicking in the thumb IP.  No tenderness today over the thumb A1 pulley, but continued decreased numbness in the index and middle. Negative Tinel in the volar wrist today.  Circumferences in upper extremity decreased overall from last session, slightly increased at elbow,  .5 cm. Reinstructed in MLD this date with modification to perform in sitting with arm in supported position on table since she was unable to perform in supine due to ongoing shoulder issues (being seen by PT). Patient to continue to focus on active range of motion with focus on wrist extension.  Patient limited in functional use of left hand in ADLs and IADLs.  Patient can benefit from skilled OT services to decrease edema and pain and  increase motion.  Patient to keep pain under 2/10 focusing on decreasing edema and pain and motion.  Will address strength after these factors.  Patient can benefit from skilled OT services to increase use of left hand in ADLs and IADLs to return to prior level of function.  PERFORMANCE DEFICITS: in functional skills including ADLs, IADLs, ROM, strength, pain, flexibility, decreased knowledge of use of DME, and UE functional use,   and psychosocial skills including environmental adaptation and routines and behaviors.   IMPAIRMENTS: are limiting patient from ADLs, IADLs, rest and sleep, play, leisure, and social participation.   COMORBIDITIES: has no other co-morbidities that affects occupational performance. Patient will benefit from skilled OT to address above impairments and improve overall function.  MODIFICATION OR ASSISTANCE TO COMPLETE EVALUATION: No modification of tasks or assist necessary to complete an evaluation.  OT OCCUPATIONAL PROFILE AND HISTORY: Problem focused assessment: Including review of records relating to presenting problem.  CLINICAL DECISION MAKING: LOW - limited treatment options, no task modification necessary  REHAB POTENTIAL: Good for goals  EVALUATION COMPLEXITY: Low   PLAN:  OT FREQUENCY: 1-2x/week  OT DURATION: 6 wks   PLANNED INTERVENTIONS: 97168 OT Re-evaluation, 97535 self care/ADL training, 16109 therapeutic exercise, 97530 therapeutic activity, 97112 neuromuscular re-education, 97140 manual therapy, 97018 paraffin, 60454 fluidotherapy, 97034 contrast bath, 97760 Splinting (initial encounter), passive range of motion, patient/family education, and DME and/or AE instructions  CONSULTED AND AGREED WITH PLAN OF CARE: Patient   Itay Mella, OTR/L,CLT 11/30/2023, 4:06 PM

## 2023-12-01 ENCOUNTER — Inpatient Hospital Stay: Payer: Medicare Other

## 2023-12-01 ENCOUNTER — Inpatient Hospital Stay: Payer: Medicare Other | Attending: Oncology | Admitting: Oncology

## 2023-12-01 ENCOUNTER — Encounter: Payer: Self-pay | Admitting: Oncology

## 2023-12-01 VITALS — BP 116/76 | HR 74 | Temp 97.2°F | Resp 18 | Wt 198.7 lb

## 2023-12-01 DIAGNOSIS — Z87891 Personal history of nicotine dependence: Secondary | ICD-10-CM | POA: Diagnosis not present

## 2023-12-01 DIAGNOSIS — Z803 Family history of malignant neoplasm of breast: Secondary | ICD-10-CM | POA: Diagnosis not present

## 2023-12-01 DIAGNOSIS — E063 Autoimmune thyroiditis: Secondary | ICD-10-CM | POA: Insufficient documentation

## 2023-12-01 DIAGNOSIS — Z170 Estrogen receptor positive status [ER+]: Secondary | ICD-10-CM | POA: Insufficient documentation

## 2023-12-01 DIAGNOSIS — Z08 Encounter for follow-up examination after completed treatment for malignant neoplasm: Secondary | ICD-10-CM

## 2023-12-01 DIAGNOSIS — Z79811 Long term (current) use of aromatase inhibitors: Secondary | ICD-10-CM | POA: Diagnosis not present

## 2023-12-01 DIAGNOSIS — M8580 Other specified disorders of bone density and structure, unspecified site: Secondary | ICD-10-CM | POA: Diagnosis not present

## 2023-12-01 DIAGNOSIS — C50411 Malignant neoplasm of upper-outer quadrant of right female breast: Secondary | ICD-10-CM | POA: Diagnosis not present

## 2023-12-01 DIAGNOSIS — Z809 Family history of malignant neoplasm, unspecified: Secondary | ICD-10-CM | POA: Insufficient documentation

## 2023-12-01 DIAGNOSIS — Z923 Personal history of irradiation: Secondary | ICD-10-CM | POA: Diagnosis not present

## 2023-12-01 DIAGNOSIS — Z9221 Personal history of antineoplastic chemotherapy: Secondary | ICD-10-CM | POA: Diagnosis not present

## 2023-12-01 DIAGNOSIS — F1090 Alcohol use, unspecified, uncomplicated: Secondary | ICD-10-CM | POA: Diagnosis not present

## 2023-12-01 LAB — CBC WITH DIFFERENTIAL (CANCER CENTER ONLY)
Abs Immature Granulocytes: 0.01 10*3/uL (ref 0.00–0.07)
Basophils Absolute: 0 10*3/uL (ref 0.0–0.1)
Basophils Relative: 1 %
Eosinophils Absolute: 0.2 10*3/uL (ref 0.0–0.5)
Eosinophils Relative: 3 %
HCT: 38.9 % (ref 36.0–46.0)
Hemoglobin: 12.9 g/dL (ref 12.0–15.0)
Immature Granulocytes: 0 %
Lymphocytes Relative: 27 %
Lymphs Abs: 1.4 10*3/uL (ref 0.7–4.0)
MCH: 29.9 pg (ref 26.0–34.0)
MCHC: 33.2 g/dL (ref 30.0–36.0)
MCV: 90 fL (ref 80.0–100.0)
Monocytes Absolute: 0.7 10*3/uL (ref 0.1–1.0)
Monocytes Relative: 13 %
Neutro Abs: 2.8 10*3/uL (ref 1.7–7.7)
Neutrophils Relative %: 56 %
Platelet Count: 246 10*3/uL (ref 150–400)
RBC: 4.32 MIL/uL (ref 3.87–5.11)
RDW: 12.9 % (ref 11.5–15.5)
WBC Count: 5.1 10*3/uL (ref 4.0–10.5)
nRBC: 0 % (ref 0.0–0.2)

## 2023-12-01 LAB — CMP (CANCER CENTER ONLY)
ALT: 28 U/L (ref 0–44)
AST: 33 U/L (ref 15–41)
Albumin: 3.7 g/dL (ref 3.5–5.0)
Alkaline Phosphatase: 116 U/L (ref 38–126)
Anion gap: 9 (ref 5–15)
BUN: 11 mg/dL (ref 8–23)
CO2: 27 mmol/L (ref 22–32)
Calcium: 9.1 mg/dL (ref 8.9–10.3)
Chloride: 103 mmol/L (ref 98–111)
Creatinine: 0.63 mg/dL (ref 0.44–1.00)
GFR, Estimated: >60 mL/min (ref >60)
Glucose, Bld: 95 mg/dL (ref 70–99)
Potassium: 4.2 mmol/L (ref 3.5–5.1)
Sodium: 139 mmol/L (ref 135–145)
Total Bilirubin: 0.6 mg/dL (ref 0.0–1.2)
Total Protein: 7.4 g/dL (ref 6.5–8.1)

## 2023-12-01 NOTE — Assessment & Plan Note (Addendum)
 Stage I right breast cancer, T1c N1 M0 ER+, PR+ HER2 negative.  S/p lumpectomy w SLNB, adjuvant chemotherapy and radiation.  Recommend patient to continue endocrine therapy with Arimidex  1mg  daily for at least 5 years [started in July 2020]. Share decision was made to extend therapy beyond 5 year. .  Continue annual screening mammogram, next due April 2026

## 2023-12-01 NOTE — Progress Notes (Signed)
 Hematology/Oncology Progress note [phone] [phone]        REFERRING PROVIDER: Sheron Dixons, MD    CHIEF COMPLAINTS/PURPOSE OF CONSULTATION:  Right Breast cancer.   ASSESSMENT & PLAN:   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 in female, estrogen receptor positive (HCC) Stage I right breast cancer, T1c N1 M0 ER+, PR+ HER2 negative.  S/p lumpectomy w SLNB, adjuvant chemotherapy and radiation.  Recommend patient to continue endocrine therapy with Arimidex  1mg  daily for at least 5 years [started in July 2020]. Share decision was made to extend therapy beyond 5 year. .  Continue annual screening mammogram, next due April 2026  Osteopenia 10/26/2022 DEXA showed osteopenia. FRAX  10 year major pathological fracture 8.2% Recommend patient to take calcium and vitamin D supplementation.    Orders Placed This Encounter  Procedures   CMP (Cancer Center only)    Standing Status:   Future    Expected Date:   06/02/2024    Expiration Date:   11/30/2024   CBC with Differential (Cancer Center Only)    Standing Status:   Future    Expected Date:   06/02/2024    Expiration Date:   11/30/2024   Follow up in 6 months All questions were answered. The patient knows to call the clinic with any problems, questions or concerns.  Timmy Forbes, MD, PhD [facility] Hematology Oncology 12/01/2023    HISTORY OF PRESENTING ILLNESS:  Alexa Knight 68 y.o. female presents to establish care for history of right breast cancer.  I have reviewed her chart and materials related to her cancer extensively and collaborated history with the patient. Summary of oncologic history is as follows: Oncology History  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 in female, estrogen receptor positive (HCC)  07/28/2018 Initial Diagnosis   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 in female  2019 She had T1cN1a IDC of the right breast that was ER/PR positive and HER-2/neu  negative.  She was being followed by Dr. Tamala Fair in [facility] [place]  and patient has now relocated to [place]  and was followed by Dr. Randy Buttery. Core needle biopsy had showed grade 1 ER 100% positive PR 10% positive and HER-2 negative tumor with a Ki-67 of 5%.  In October 2019 she underwent right lumpectomy with sentinel lymph node dissection which showed a 1.5 cm tumor with positive LVI and 2 out of 2 sentinel lymph nodes that were positive with no evidence of extra nuclear extension.  She received adjuvant dose dense AC chemotherapy and weekly Taxol between December 2019 to May 2020.  She then completed adjuvant radiation therapy and started Arimidex  in July 2020.       Genetic Testing   The [facility] Cancer[facility]-Expanded+RNA Panel found no pathogenic mutations.    05/27/2020 Cancer Staging   Staging form: Breast, AJCC 8th Edition - Clinical stage from 05/27/2020: cT1, cN1, cM0, ER+, PR+, HER2- - Signed by Timmy Forbes, MD on 06/02/2023 Stage prefix: Initial diagnosis   11/18/2022 Mammogram   Bilateral screening mammogram showed No mammographic evidence of malignancy.      has history of Hashimoto's thyroiditis for which she is on levothyroxine.  Patient was previously followed by Dr. Randy Buttery and switch to me on 06/02/23.  She take Arimidex  1mg  daily, overall she tolerates well with manageable hot flash.  No new complaints.    MEDICAL HISTORY:  Past Medical History:  Diagnosis Date   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, estrogen receptor positive (HCC)  2019   a.) stage I RIGHT breast cancer (T1c N1 M0, ER/PR+ HER2/neu -); s/p systemic chemotherapy + XRT + endocrine therapy   Hashimoto's thyroiditis    Hypothyroidism    Long term current use of aromatase inhibitor    a.) anastrozole    Mixed hyperlipidemia    Multinodular goiter    Osteopenia    Personal history of chemotherapy 2019   Rt breast   Personal history of radiation therapy 2019   Rt breast   Raynaud's  syndrome    Rheumatoid factor positive     SURGICAL HISTORY: Past Surgical History:  Procedure Laterality Date   BREAST BIOPSY Left    stereo-neg   BREAST BIOPSY Right 2019   BREAST LUMPECTOMY Right 2019   invasive ductal carcinoma   COLONOSCOPY     SHOULDER ARTHROSCOPY WITH SUBACROMIAL DECOMPRESSION, ROTATOR CUFF REPAIR AND BICEP TENDON REPAIR Left 08/17/2023   Procedure: SHOULDER ARTHROSCOPY WITH DEBRIDEMENT, DECOMPRESSION, ROTATOR CUFF REPAIR AND BICEPS TENODESIS.;  Surgeon: Elner Hahn, MD;  Location: [facility] ORS;  Service: Orthopedics;  Laterality: Left;    SOCIAL HISTORY: Social History   Socioeconomic History   Marital status: Married    Spouse name: Avis Lemming   Number of children: 2   Years of education: Not on file   Highest education level: 12th grade  Occupational History   Not on file  Tobacco Use   Smoking status: Former    Current packs/day: 0.00    Average packs/day: 1 pack/day for 26.0 years (26.0 ttl pk-yrs)    Types: Cigarettes    Start date: 63    Quit date: 2000    Years since quitting: 25.3   Smokeless tobacco: Never  Vaping Use   Vaping status: Never Used  Substance and Sexual Activity   Alcohol use: Yes    Comment: seldom   Drug use: Never   Sexual activity: Not Currently    Partners: Male  Other Topics Concern   Not on file  Social History Narrative   Not on file   Social Drivers of Health   Financial Resource Strain: Low Risk  (10/17/2023)   Overall Financial Resource Strain (CARDIA)    Difficulty of Paying Living Expenses: Not hard at all  Food Insecurity: No Food Insecurity (10/17/2023)   Hunger Vital Sign    Worried About Running Out of Food in the Last Year: Never true    Ran Out of Food in the Last Year: Never true  Transportation Needs: No Transportation Needs (10/17/2023)   PRAPARE - Administrator, Civil Service (Medical): No    Lack of Transportation (Non-Medical): No  Physical Activity: Inactive (10/17/2023)    Exercise Vital Sign    Days of Exercise per Week: 0 days    Minutes of Exercise per Session: 60 min  Stress: No Stress Concern Present (10/17/2023)   [employer] of Occupational Health - Occupational Stress Questionnaire    Feeling of Stress : Not at all  Social Connections: Socially Integrated (10/17/2023)   Social Connection and Isolation Panel [NHANES]    Frequency of Communication with Friends and Family: More than three times a week    Frequency of Social Gatherings with Friends and Family: More than three times a week    Attends Religious Services: More than 4 times per year    Active Member of [employer] or Organizations: Yes    Attends Engineer, structural: More than 4 times per year    Marital Status: Married  Intimate Partner Violence: Not At Risk (03/17/2023)   Humiliation, Afraid, Rape, and Kick questionnaire    Fear of Current or Ex-Partner: No    Emotionally Abused: No    Physically Abused: No    Sexually Abused: No    FAMILY HISTORY: Family History  Problem Relation Age of Onset   Breast cancer Mother 13       d. recurrence 10   Heart attack Father    Prostate cancer Brother    Prostate cancer Brother    Breast cancer Maternal Aunt        dx 41s   Cancer Paternal Aunt        unk type    ALLERGIES:  has no known allergies.  MEDICATIONS:  Current Outpatient Medications  Medication Sig Dispense Refill   anastrozole  (ARIMIDEX ) 1 MG tablet Take 1 tablet (1 mg total) by mouth daily. 90 tablet 3   Calcium Carb-Cholecalciferol (CALCIUM 600 + D PO) Take 2 tablets by mouth daily.     levothyroxine (SYNTHROID) 100 MCG tablet Take 100 mcg by mouth daily before breakfast.      Multiple Vitamin (MULTI-VITAMIN DAILY PO) Take 1 tablet by mouth daily.     omeprazole  (PRILOSEC) 20 MG capsule Take 1 capsule by mouth once daily 90 capsule 0   No current facility-administered medications for this visit.    Review of Systems  Constitutional:  Negative for  appetite change, chills, fatigue and fever.  HENT:   Negative for hearing loss and voice change.   Eyes:  Negative for eye problems.  Respiratory:  Negative for chest tightness and cough.   Cardiovascular:  Negative for chest pain.  Gastrointestinal:  Negative for abdominal distention, abdominal pain and blood in stool.  Endocrine: Negative for hot flashes.  Genitourinary:  Negative for difficulty urinating and frequency.   Musculoskeletal:  Negative for arthralgias.  Skin:  Negative for itching and rash.  Neurological:  Negative for extremity weakness.  Hematological:  Negative for adenopathy.  Psychiatric/Behavioral:  Negative for confusion.      PHYSICAL EXAMINATION: ECOG PERFORMANCE STATUS: 0 - Asymptomatic  Vitals:   12/01/23 0956  BP: 116/76  Pulse: 74  Resp: 18  Temp: (!) 97.2 F (36.2 C)  SpO2: 98%   Filed Weights   12/01/23 0956  Weight: 198 lb 11.2 oz (90.1 kg)    Physical Exam Constitutional:      General: She is not in acute distress.    Appearance: She is not diaphoretic.  HENT:     Head: Normocephalic and atraumatic.  Eyes:     General: No scleral icterus. Cardiovascular:     Rate and Rhythm: Normal rate and regular rhythm.     Heart sounds: No murmur heard. Pulmonary:     Effort: Pulmonary effort is normal. No respiratory distress.  Abdominal:     General: There is no distension.     Palpations: Abdomen is soft.     Tenderness: There is no abdominal tenderness.  Musculoskeletal:        General: Normal range of motion.     Cervical back: Normal range of motion and neck supple.  Skin:    General: Skin is warm and dry.     Findings: No erythema.  Neurological:     Mental Status: She is alert and oriented to person, place, and time.     Cranial Nerves: No cranial nerve deficit.     Motor: No abnormal muscle tone.     Coordination: Coordination  normal.  Psychiatric:        Mood and Affect: Affect normal.     LABORATORY DATA:  I have  reviewed the data as listed    Latest Ref Rng & Units 12/01/2023    9:38 AM 08/11/2023    2:42 PM 10/14/2022    9:20 AM  CBC  WBC 4.0 - 10.5 K/uL 5.1  5.4  4.8   Hemoglobin 12.0 - 15.0 g/dL 60.4  54.0  98.1   Hematocrit 36.0 - 46.0 % 38.9  39.1  39.9   Platelets 150 - 400 K/uL 246  272  260       Latest Ref Rng & Units 12/01/2023    9:38 AM 08/11/2023    2:42 PM 10/14/2022    9:20 AM  CMP  Glucose 70 - 99 mg/dL 95  191  85   BUN 8 - 23 mg/dL 11  12  12    Creatinine 0.44 - 1.00 mg/dL 4.78  2.95  6.21   Sodium 135 - 145 mmol/L 139  136  140   Potassium 3.5 - 5.1 mmol/L 4.2  3.7  4.6   Chloride 98 - 111 mmol/L 103  102  103   CO2 22 - 32 mmol/L 27  26  23    Calcium 8.9 - 10.3 mg/dL 9.1  9.4  9.2   Total Protein 6.5 - 8.1 g/dL 7.4   7.2   Total Bilirubin 0.0 - 1.2 mg/dL 0.6   0.7   Alkaline Phos 38 - 126 U/L 116   91   AST 15 - 41 U/L 33   22   ALT 0 - 44 U/L 28   18      RADIOGRAPHIC STUDIES: I have personally reviewed the radiological images as listed and agreed with the findings in the report. No results found.

## 2023-12-01 NOTE — Assessment & Plan Note (Signed)
 10/26/2022 DEXA showed osteopenia. FRAX  10 year major pathological fracture 8.2% Recommend patient to take calcium and vitamin D supplementation.

## 2023-12-02 DIAGNOSIS — M25612 Stiffness of left shoulder, not elsewhere classified: Secondary | ICD-10-CM | POA: Diagnosis not present

## 2023-12-02 DIAGNOSIS — M25512 Pain in left shoulder: Secondary | ICD-10-CM | POA: Diagnosis not present

## 2023-12-02 DIAGNOSIS — R531 Weakness: Secondary | ICD-10-CM | POA: Diagnosis not present

## 2023-12-02 DIAGNOSIS — Z9889 Other specified postprocedural states: Secondary | ICD-10-CM | POA: Diagnosis not present

## 2023-12-06 ENCOUNTER — Ambulatory Visit: Admitting: Occupational Therapy

## 2023-12-06 DIAGNOSIS — R600 Localized edema: Secondary | ICD-10-CM | POA: Diagnosis not present

## 2023-12-06 DIAGNOSIS — M25632 Stiffness of left wrist, not elsewhere classified: Secondary | ICD-10-CM | POA: Diagnosis not present

## 2023-12-06 DIAGNOSIS — M25642 Stiffness of left hand, not elsewhere classified: Secondary | ICD-10-CM | POA: Diagnosis not present

## 2023-12-06 DIAGNOSIS — M25532 Pain in left wrist: Secondary | ICD-10-CM

## 2023-12-06 DIAGNOSIS — M6281 Muscle weakness (generalized): Secondary | ICD-10-CM

## 2023-12-06 NOTE — Therapy (Signed)
 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, estrogen receptor positive (HCC) 2019   a.) stage I RIGHT breast cancer (T1c N1 M0, ER/PR+ HER2/neu -); s/p systemic chemotherapy + XRT + endocrine therapy   Hashimoto's thyroiditis    Hypothyroidism    Long term current use of aromatase inhibitor    a.) anastrozole    Mixed hyperlipidemia    Multinodular goiter    Osteopenia    Personal history of chemotherapy 2019   Rt breast   Personal history of radiation therapy 2019   Rt breast   Raynaud's syndrome    Rheumatoid factor positive    Past Surgical History:  Procedure Laterality Date   BREAST BIOPSY Left    stereo-neg   BREAST BIOPSY Right 2019   BREAST LUMPECTOMY Right 2019   invasive ductal carcinoma   COLONOSCOPY     SHOULDER ARTHROSCOPY WITH SUBACROMIAL DECOMPRESSION, ROTATOR CUFF REPAIR AND BICEP TENDON REPAIR Left 08/17/2023   Procedure: SHOULDER ARTHROSCOPY WITH DEBRIDEMENT, DECOMPRESSION, ROTATOR CUFF REPAIR AND BICEPS TENODESIS.;  Surgeon: Elner Hahn, MD;  Location: [facility] ORS;  Service: Orthopedics;  Laterality: Left;   Patient Active Problem List   Diagnosis Date Noted   Osteopenia 06/02/2023   Genetic testing 07/17/2022   ANA positive 10/30/2020   Rheumatoid factor positive 10/30/2020   Mixed hyperlipidemia 10/05/2020   Raynaud's syndrome 10/04/2020   Multinodular goiter 06/12/2020   Pharyngeal dysphagia 06/12/2020    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 in female, estrogen receptor positive (HCC) 07/28/2018    ONSET DATE: 08/17/23  REFERRING DIAG: L shoulder surgery with increase hand edema , pain and weakness  THERAPY DIAG:  Stiffness of left hand, not elsewhere classified  Stiffness of left wrist, not elsewhere classified  Localized edema  Pain in left wrist  Muscle weakness (generalized)  Rationale for Evaluation and Treatment: Rehabilitation  SUBJECTIVE:   SUBJECTIVE STATEMENT: I can still feel the congestion my hand and wrist.  But is improving.  I was able to work in the yard yesterday and pulling weeds and it really felt congested but it decreased by the evening and this morning even also.  Able to sleep in the bed now and elevate my hand.  Still feeling a little click in my thumb at times.  The swelling at my wrist is decreasing.  The numbness in my thumb is improving.  The swelling in my arm feels better.  And that zinger from my wrist into my hand is less Pt accompanied by: self  PERTINENT HISTORY: [facility] visit note 10/01/23 Skin inspection of the left hand is notable for mild swelling, but otherwise is unremarkable. No erythema, ecchymosis, abrasions, or other skin abnormalities are identified. She has mild tenderness palpation along the ulnar aspect of her wrist, but there are no other areas of tenderness around the wrist or hand. She is able to actively flex extend  all digits without any pain or triggering, but is unable to fully flex her thumb, index, and long fingers. Passively, each of the index and long MCP joints can be flexed to 90 degrees, while each of the index and long PIP joints can be flexed passively to 100 degrees. She is grossly neurovascular intact to all digits, other than some subjectively decreased sensation light touch to the thumb greater than index finger tips.  Assessment: Encounter Diagnoses  Name Primary?  Nontraumatic complete tear of left rotator  cuff Yes  Rotator cuff tendinitis, left  Tendinitis of upper biceps tendon of left shoulder   Plan: The treatment options were discussed with the patient and her husband. In addition, patient educational materials were provided regarding the diagnosis and treatment options. Regarding her left shoulder, the patient is pleased with her symptomatic and functional improvement at this time. I have recommended that she continue with physical therapy and home exercises in order to optimize her range of motion, strength, and overall function.  Regarding her left hand/wrist symptoms, most likely the symptoms are due either to the position of her arm in her sling postoperatively or perhaps her nerve being irritated by the nerve block placed preoperative by the anesthesiologist. Regardless, the patient is advised that these symptoms most likely will resolve over time. Meanwhile, the patient will be sent to occupational therapy to work on range of motion and strength exercises to the hand in order to optimize her likelihood of regaining full function. She may progress in her activities as symptoms remain, but is to avoid offending activities. He may take over-the-counter medications as needed for discomfort. All of the patient's questions and concerns were   PRECAUTIONS: No weightbearing post shoulder surgery    WEIGHT BEARING RESTRICTIONS: Nonweightbearing left upper extremity  PAIN:  Are you having pain?  No pain may be just shooting little pain in my thumb at times  FALLS: Has patient fallen in last 6 months? Yes. Number of falls 1  LIVING ENVIRONMENT: Lives with: lives with their spouse   PLOF: Patient is retired but very active playing the [employer], working the yard, gardening, Archivist and crocheting  PATIENT GOALS: I want to be able to use my hand.  Make a fist get the numbness and swelling better.  NEXT MD VISIT: Next month  OBJECTIVE:  Note: Objective measures were completed at Evaluation  unless otherwise noted.  HAND DOMINANCE: Right  FUNCTIONAL OUTCOME MEASURES: To be assessed next session  UPPER EXTREMITY ROM:     Active ROM Right eval Left eval L 10/26/23 L 10/29/23 L 11/11/23 L 11/18/23  Shoulder flexion        Shoulder abduction        Shoulder adduction        Shoulder extension        Shoulder internal rotation        Shoulder external rotation        Elbow flexion        Elbow extension        Wrist flexion  70 volar wrist pain 90 90 pain over dorsal wrist  90 90  Wrist extension  65 dorsal wrist pain 70 pull dorsal wrist 70 pull over dorsal wrist  75 70  Wrist ulnar deviation  30 volar wrist pain 40 pain volar wrist  40 40 pull volar wrist  40  Wrist radial deviation  20 volar wrist pain 25 volar wrist  25 25 25   Wrist pronation  90 90 90  Wrist supination    90 90 pain with resistance 90  (Blank rows = not tested)  Active ROM Right eval Left eval L 10/26/23 L 10/29/23 L 11/11/23 L 11/18/23 L 11/22/23  Thumb MCP (0-60)       60  Thumb IP (0-80)      Little click per pt 45 R 75  Thumb Radial abd/add (0-55)  50  WNL   Pain  volar MC decrease  Pain  volar MC decrease   Thumb Palmar abd/add (0-45)   50 WN   WNL WNL  Thumb Opposition to Small Finger  Sharp volar wrist pain with opposition to fifth 2nd fold pain volar wrist    Pain at volar thumb MC with opposition   Index MCP (0-90)  70  80 85 85    Index PIP (0-100)   90 95 95 100    Index DIP (0-70)           Long MCP (0-90)    70 85 85 90    Long PIP (0-100)    85 95 100 100    Long DIP (0-70)           Ring MCP (0-90)    50 85 90 90    Ring PIP (0-100)    90 95 95 100    Ring DIP (0-70)           Little MCP (0-90)    40 90 90 90    Little PIP (0-100)    90 100 100 100    Little DIP (0-70)           (Blank rows = not tested)    HAND FUNCTION: 10/26/23 Grip strength: Right: NT lbs; Left: NT lbs, Lateral pinch: Right: 18 lbs, Left: 6 lbs, and 3 point pinch: Right: 17 lbs, Left: 5 lbs 10/29/23 Grip  strength: Right: NT lbs; Left: NT lbs, Lateral pinch: Right: 18 lbs, Left: 8 lbs, and 3 point pinch: Right: 17 lbs, Left: 8 lbs 11/11/23 Grip strength: Right: 65 lbs; Left: 42 lbs, Lateral pinch: Right: 18 lbs, Left: 13 lbs, and 3 point pinch: Right: 17 lbs, Left:10 lbs pain thumb volar MC with 3 point 12/06/23 Grip strength: Right: 65 lbs; Left: 42 lbs, Lateral pinch: Right: 18 lbs, Left: 13 lbs, and 3 point pinch: Right: 17 lbs, Left:11 lbs   COORDINATION: Decreased because of increased edema and stiffness and pain as well as numbness at eval 10/26/23 assess individual tendon glides as well as able to do different signs of digits.  Difficulty initiate as well as opposition from palm to fingertips difficulty mostly with fourth and fifth.  SENSATION: At eval patient report numbness in most of the thumb.  In the tips of second and third digits  EDEMA: Increase edema in the volar wrist as well as in the hand on the left  10/29/23 patient report increased fullness and heaviness in the upper arm on the left.  Since surgery. See lymphedema flowsheet for circumferences  COGNITION: Overall cognitive status: Within functional limits for tasks assessed   TREATMENT DATE: 12/06/23       Patient reports she thinks she is finished with PT.  She has a follow-up appointment with surgeon on 2 June.  She has not received a call yet with the date of her nerve conduction study yet.    Patient arrived with improvement in edema in her left upper extremity. Patient continues to have increased  edema in the left hand and wrist with use but is decreasing at the end of the evening as well as in the morning showing less than before.  And that is without compression wearing Patient continues to be tender over the left thumb A1 pulley 5/10 No locking but still a click present Patient continues to do contrast in the morning prior to range of motion for hand and wrist Encouraged to remind her to do also at the end of the day  contrast.   Pt able to demonstrate a composite fist, but little tighter feeling in the index and third digit but improving   Patient to continue with contrast in the morning. Measurements for left upper extremity   Strength in wrist and forearm in all planes 5 -/5 with no pain.  Circumferential measurements: Upper arm 15 cm from elbow: Right 35.5 and left 34 Upper arm 10 cm from elbow: Right 32.4 and left 32 cm Elbow : Right 26 and left 26.5 cm 15 cm R 25.1: L 23.7 10 cm  R 21.4 L 20.5 Wrist R 16 cm , L 15.5 Hand R 20 cm ; L 19 cm  Decreases noted this date in forearm and upper arm, elbow slightly up by  .5 cm                                                                             Therapeutic Exercises:  Active range of motion tendon glides with composite fist to palm pain-free all. Active range of motion for thumb palmar radial abduction 10 reps Block passive range of motion to thumb IP and MC flexion  prior to opposition Continue to focus on wrist extension  As well as modifications when doing yard work and tasks around the house.  Enlarging grips and using palmar forearm.    PATIENT EDUCATION: Education details: findings of eval and HEP  Person educated: Patient Education method: Explanation, Demonstration, Tactile cues, Verbal cues, and Handouts Education comprehension: verbalized understanding, returned demonstration, verbal cues required, and needs further education  GOALS: Goals reviewed with patient? Yes  LONG TERM GOALS: Target date: 8 wks   Patient to be independent home program to increase digit flexion to touching palm without increase symptoms Baseline: Patient present with increased edema in hand and volar wrist with MC flexion 40 to 70 degrees and PIP 85-90 Goal status:MET  2.  Left wrist active range of motion increased to within normal limits symptom-free for patient to use left hand in bathing and dressing Baseline: Left wrist extension 65 and  flexion 70 degrees with increased pain in volar and dorsal forearm.  Radial ulnar deviation active range of motion within normal limits but increased pain over volar wrist Goal status:MET  3.  Thumb active range of motion all planes improved to within normal limits for patient to do buttons, open containers and use utensils and brush. Baseline: Thumb palmar radial abduction 50 degrees but increased tightness with pull.  Opposition to DIP of fifth with increased pain over volar wrist sharp pain NOW PA and RA WNL but composite flexion and OPposition some locking or triggering - tender 5/10 A1pulley Goal status:IMPROVING  4.  Left grip and prehension strength improved  to within functional limits for her age for patient to use hand and more than 50% of ADLs without increase symptoms Baseline: Patient with a increase edema and pain and decreased flexion of digits-grip and prehension not assessed patient 2 weeks out of sling.  Per patient PT initiating shoulder strength next week. NOW no grip and prehension strengthening done yet because of carpal tunnel symptoms as well as trigger thumb Goal status:IMPROVING  5.  L wrist and digits AROM increased to within normal limits to perform 75% of ADLs symptom-free Baseline: Decreased wrist and digit active range of motion, increased edema and pain using in ADLs less than 25% Goal status: MET  ASSESSMENT:  CLINICAL IMPRESSION: Patient seen by occupational therapy for left hand edema, numbness, pain and weakness for 9 sessions  Patient had left shoulder surgery with a nerve block on 08/17/2023.    Since evaluation patient is made great progress with edema in left UE, wrist and hand as well as increased digits and wrist range of motion with decreased pain.  Pt show decrease edema, numbness, pain in the left hand and wrist.  Patient active range of motion in wrist and digit within normal limits.  Grip and prehension improved but still limited.  Pt still report  clicking at thumb and 5/10 tenderness over the thumb A1 pulley,  and numbness improving in tip of thumb -  Tinel in the volar wrist improving.  Patient to continue to focus on active range of motion with focus on wrist extension tendon glides and PROM thumb IP and MC prior to opposition-   Patient limited in functional use of left hand in ADLs and IADLs.  Patient can benefit from skilled OT services to monitor progress in ROM , strength , sensory issue and edema - while returning to normal ADL's and IADL's.   Patient can benefit from skilled OT services to increase use of left hand in ADLs and IADLs to return to prior level of function.  PERFORMANCE DEFICITS: in functional skills including ADLs, IADLs, ROM, strength, pain, flexibility, decreased knowledge of use of DME, and UE functional use,   and psychosocial skills including environmental adaptation and routines and behaviors.   IMPAIRMENTS: are limiting patient from ADLs, IADLs, rest and sleep, play, leisure, and social participation.   COMORBIDITIES: has no other co-morbidities that affects occupational performance. Patient will benefit from skilled OT to address above impairments and improve overall function.  MODIFICATION OR ASSISTANCE TO COMPLETE EVALUATION: No modification of tasks or assist necessary to complete an evaluation.  OT OCCUPATIONAL PROFILE AND HISTORY: Problem focused assessment: Including review of records relating to presenting problem.  CLINICAL DECISION MAKING: LOW - limited treatment options, no task modification necessary  REHAB POTENTIAL: Good for goals  EVALUATION COMPLEXITY: Low   PLAN:  OT FREQUENCY:  1 x wk to biweekly and monthly   OT DURATION: 8 wks   PLANNED INTERVENTIONS: 97168 OT Re-evaluation, 97535 self care/ADL training, 69629 therapeutic exercise, 97530 therapeutic activity, 97112 neuromuscular re-education, 97140 manual therapy, 97018 paraffin, 52841 fluidotherapy, 97034 contrast bath, 97760  Splinting (initial encounter), passive range of motion, patient/family education, and DME and/or AE instructions  CONSULTED AND AGREED WITH PLAN OF CARE: Patient   Heloise Lobo, OTR/L,CLT 12/06/2023, 9:50 AM

## 2023-12-16 ENCOUNTER — Ambulatory Visit: Admitting: Occupational Therapy

## 2023-12-16 DIAGNOSIS — M25632 Stiffness of left wrist, not elsewhere classified: Secondary | ICD-10-CM | POA: Diagnosis not present

## 2023-12-16 DIAGNOSIS — R600 Localized edema: Secondary | ICD-10-CM | POA: Diagnosis not present

## 2023-12-16 DIAGNOSIS — M25642 Stiffness of left hand, not elsewhere classified: Secondary | ICD-10-CM

## 2023-12-16 DIAGNOSIS — M6281 Muscle weakness (generalized): Secondary | ICD-10-CM

## 2023-12-16 DIAGNOSIS — M25532 Pain in left wrist: Secondary | ICD-10-CM

## 2023-12-16 NOTE — Therapy (Signed)
 OUTPATIENT OCCUPATIONAL THERAPY ORTHO TREATMENT/10th visit  Patient Name: Alexa Knight MRN: [id number] DOB:[birth year], 68 y.o., female Today's Date: 12/16/2023  PCP: Dr Lourena Royal PROVIDER: Dr Daun Epstein  END OF SESSION:  OT End of Session - 12/16/23 1409     Visit Number 10    Number of Visits 12    Date for OT Re-Evaluation 02/14/24    OT Start Time 1407    OT Stop Time 1431    OT Time Calculation (min) 24 min    Activity Tolerance Patient tolerated treatment well    Behavior During Therapy [facility] for tasks assessed/performed             Past Medical History:  Diagnosis Date   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, estrogen receptor positive (HCC) 2019   a.) stage I RIGHT breast cancer (T1c N1 M0, ER/PR+ HER2/neu -); s/p systemic chemotherapy + XRT + endocrine therapy   Hashimoto's thyroiditis    Hypothyroidism    Long term current use of aromatase inhibitor    a.) anastrozole    Mixed hyperlipidemia    Multinodular goiter    Osteopenia    Personal history of chemotherapy 2019   Rt breast   Personal history of radiation therapy 2019   Rt breast   Raynaud's syndrome    Rheumatoid factor positive    Past Surgical History:  Procedure Laterality Date   BREAST BIOPSY Left    stereo-neg   BREAST BIOPSY Right 2019   BREAST LUMPECTOMY Right 2019   invasive ductal carcinoma   COLONOSCOPY     SHOULDER ARTHROSCOPY WITH SUBACROMIAL DECOMPRESSION, ROTATOR CUFF REPAIR AND BICEP TENDON REPAIR Left 08/17/2023   Procedure: SHOULDER ARTHROSCOPY WITH DEBRIDEMENT, DECOMPRESSION, ROTATOR CUFF REPAIR AND BICEPS TENODESIS.;  Surgeon: Elner Hahn, MD;  Location: [facility] ORS;  Service: Orthopedics;  Laterality: Left;   Patient Active Problem List   Diagnosis Date Noted   Osteopenia 06/02/2023   Genetic testing 07/17/2022   ANA positive 10/30/2020   Rheumatoid factor positive 10/30/2020   Mixed hyperlipidemia 10/05/2020   Raynaud's syndrome 10/04/2020   Multinodular  goiter 06/12/2020   Pharyngeal dysphagia 06/12/2020   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 in female, estrogen receptor positive (HCC) 07/28/2018    ONSET DATE: 08/17/23  REFERRING DIAG: L shoulder surgery with increase hand edema , pain and weakness  THERAPY DIAG:  Stiffness of left hand, not elsewhere classified  Stiffness of left wrist, not elsewhere classified  Localized edema  Pain in left wrist  Muscle weakness (generalized)  Rationale for Evaluation and Treatment: Rehabilitation  SUBJECTIVE:   SUBJECTIVE STATEMENT: I been out of town for a week.  I am using my left hand and arm in the yard work.  And moving mulch in working plants.  I still feel congestion my hand at times more than others.  My thumb still clicks a little bit.  Feel weak.  In my shoulder but also my grip.  I did get appointment for a nerve conduction 11 June.   Pt accompanied by: self  PERTINENT HISTORY: [facility] visit note 10/01/23 Skin inspection of the left hand is notable for mild swelling, but otherwise is unremarkable. No erythema, ecchymosis, abrasions, or other skin abnormalities are identified. She has mild tenderness palpation along the ulnar aspect of her wrist, but there are no other areas of tenderness around the wrist or hand. She is able to actively flex extend all digits without any pain or triggering, but is unable to fully flex  her thumb, index, and long fingers. Passively, each of the index and long MCP joints can be flexed to 90 degrees, while each of the index and long PIP joints can be flexed passively to 100 degrees. She is grossly neurovascular intact to all digits, other than some subjectively decreased sensation light touch to the thumb greater than index finger tips.  Assessment: Encounter Diagnoses  Name Primary?  Nontraumatic complete tear of left rotator cuff Yes  Rotator cuff tendinitis, left  Tendinitis of upper biceps tendon of left shoulder   Plan: The treatment  options were discussed with the patient and her husband. In addition, patient educational materials were provided regarding the diagnosis and treatment options. Regarding her left shoulder, the patient is pleased with her symptomatic and functional improvement at this time. I have recommended that she continue with physical therapy and home exercises in order to optimize her range of motion, strength, and overall function.  Regarding her left hand/wrist symptoms, most likely the symptoms are due either to the position of her arm in her sling postoperatively or perhaps her nerve being irritated by the nerve block placed preoperative by the anesthesiologist. Regardless, the patient is advised that these symptoms most likely will resolve over time. Meanwhile, the patient will be sent to occupational therapy to work on range of motion and strength exercises to the hand in order to optimize her likelihood of regaining full function. She may progress in her activities as symptoms remain, but is to avoid offending activities. He may take over-the-counter medications as needed for discomfort. All of the patient's questions and concerns were   PRECAUTIONS: No weightbearing post shoulder surgery    WEIGHT BEARING RESTRICTIONS: Nonweightbearing left upper extremity  PAIN:  Are you having pain?  Tenderness over left thumb MCP 5/10 FALLS: Has patient fallen in last 6 months? Yes. Number of falls 1  LIVING ENVIRONMENT: Lives with: lives with their spouse   PLOF: Patient is retired but very active playing the [employer], working the yard, gardening, Archivist and crocheting  PATIENT GOALS: I want to be able to use my hand.  Make a fist get the numbness and swelling better.  NEXT MD VISIT: Next month  OBJECTIVE:  Note: Objective measures were completed at Evaluation unless otherwise noted.  HAND DOMINANCE: Right  FUNCTIONAL OUTCOME MEASURES: To be assessed next session  UPPER EXTREMITY ROM:     Active  ROM Right eval Left eval L 10/26/23 L 10/29/23 L 11/11/23 L 11/18/23  Shoulder flexion        Shoulder abduction        Shoulder adduction        Shoulder extension        Shoulder internal rotation        Shoulder external rotation        Elbow flexion        Elbow extension        Wrist flexion  70 volar wrist pain 90 90 pain over dorsal wrist  90 90  Wrist extension  65 dorsal wrist pain 70 pull dorsal wrist 70 pull over dorsal wrist  75 70  Wrist ulnar deviation  30 volar wrist pain 40 pain volar wrist  40 40 pull volar wrist  40  Wrist radial deviation  20 volar wrist pain 25 volar wrist  25 25 25   Wrist pronation    90 90 90  Wrist supination    90 90 pain with resistance 90  (Blank rows = not tested)  Active ROM Right eval Left eval L 10/26/23 L 10/29/23 L 11/11/23 L 11/18/23 L 11/22/23 L 12/16/23  Thumb MCP (0-60)       60 60  Thumb IP (0-80)      Little click per pt 45 R 75 55/R 75  Thumb Radial abd/add (0-55)  50  WNL   Pain  volar MC decrease  Pain  volar MC decrease  5/10 volar MC  Thumb Palmar abd/add (0-45)   50 WN   WNL WNL   Thumb Opposition to Small Finger  Sharp volar wrist pain with opposition to fifth 2nd fold pain volar wrist    Pain at volar thumb MC with opposition    Index MCP (0-90)  70  80 85 85     Index PIP (0-100)   90 95 95 100     Index DIP (0-70)            Long MCP (0-90)    70 85 85 90     Long PIP (0-100)    85 95 100 100     Long DIP (0-70)            Ring MCP (0-90)    50 85 90 90     Ring PIP (0-100)    90 95 95 100     Ring DIP (0-70)            Little MCP (0-90)    40 90 90 90     Little PIP (0-100)    90 100 100 100     Little DIP (0-70)            (Blank rows = not tested)    HAND FUNCTION: 10/26/23 Grip strength: Right: NT lbs; Left: NT lbs, Lateral pinch: Right: 18 lbs, Left: 6 lbs, and 3 point pinch: Right: 17 lbs, Left: 5 lbs 10/29/23 Grip strength: Right: NT lbs; Left: NT lbs, Lateral pinch: Right: 18 lbs, Left: 8 lbs, and 3 point  pinch: Right: 17 lbs, Left: 8 lbs 11/11/23 Grip strength: Right: 65 lbs; Left: 42 lbs, Lateral pinch: Right: 18 lbs, Left: 13 lbs, and 3 point pinch: Right: 17 lbs, Left:10 lbs pain thumb volar MC with 3 point 12/06/23 Grip strength: Right: 65 lbs; Left: 42 lbs, Lateral pinch: Right: 18 lbs, Left: 13 lbs, and 3 point pinch: Right: 17 lbs, Left:11 lbs  12/16/23 Grip strength: Right: 66 lbs; Left: 40 lbs, Lateral pinch: Right: 19 lbs, Left: 15 lbs, and 3 point pinch: Right: 19lbs, Left:14 lbs   COORDINATION: Decreased because of increased edema and stiffness and pain as well as numbness at eval 10/26/23 assess individual tendon glides as well as able to do different signs of digits.  Difficulty initiate as well as opposition from palm to fingertips difficulty mostly with fourth and fifth.  SENSATION: At eval patient report numbness in most of the thumb.  In the tips of second and third digits  EDEMA: Increase edema in the volar wrist as well as in the hand on the left  10/29/23 patient report increased fullness and heaviness in the upper arm on the left.  Since surgery. See lymphedema flowsheet for circumferences  COGNITION: Overall cognitive status: Within functional limits for tasks assessed   TREATMENT DATE: 12/06/23       Patient report done with physical therapy for shoulder.   She has a follow-up appointment with surgeon on 2 June.  She has nerve conduction test scheduled for 11 June  Patient reports increased use of left upper extremity and yard work and at home activities.  Continue to be mostly limited in stiffness and tightness or fullness in the morning especially.   During the day still continues to have some weakness in the hands as well as the shoulder.   Upon assessment patient's 3-point pinch and lateral pinch increase.   Grip still decreased.  But patient still with some swelling and fullness in the fingers unable to make a tight fist.   Edema on the left upper extremity still  fluctuates.   Patient continues to be tender over the left thumb A1 pulley 5/10 No locking but still a click present Patient to show increased thumb IP flexion. Encouraged patient still To continue contrast in the morning in the end of the day in the left hand. Patient reports numbness in the tip of the thumb improving. But continues to be tender over the volar wrist.  Negative Tinel.    Strength in wrist and forearm in all planes 5 -/5 with no pain.  Circumferential measurements: Upper arm 15 cm from elbow: Right 35.5 and left 35.4 Upper arm 10 cm from elbow: Right 32.4 and left 32 cm Elbow : Right 26 and left 26cm 15 cm R 25.1: L 23.7 10 cm  R 21.4 L 21 Wrist R 16 cm , L 16 Hand R 20 cm ; L 19 cm                                                                              Therapeutic Exercises:  Active range of motion tendon glides with composite fist to palm pain-free all. Active range of motion for thumb palmar radial abduction 10 reps Block passive range of motion to thumb IP and MC flexion  prior to opposition Continue to focus on wrist extension  As well as modifications when doing yard work and tasks around the house.  Enlarging grips and using palmar forearm.    PATIENT EDUCATION: Education details: findings of eval and HEP  Person educated: Patient Education method: Explanation, Demonstration, Tactile cues, Verbal cues, and Handouts Education comprehension: verbalized understanding, returned demonstration, verbal cues required, and needs further education  GOALS: Goals reviewed with patient? Yes  LONG TERM GOALS: Target date: 8 wks   Patient to be independent home program to increase digit flexion to touching palm without increase symptoms Baseline: Patient present with increased edema in hand and volar wrist with MC flexion 40 to 70 degrees and PIP 85-90 Goal status:MET  2.  Left wrist active range of motion increased to within normal limits symptom-free for  patient to use left hand in bathing and dressing Baseline: Left wrist extension 65 and flexion 70 degrees with increased pain in volar and dorsal forearm.  Radial ulnar deviation active range of motion within normal limits but increased pain over volar wrist Goal status:MET  3.  Thumb active range of motion all planes improved to within normal limits for patient to do buttons, open containers and use utensils and brush. Baseline: Thumb palmar radial abduction 50 degrees but increased tightness with pull.  Opposition to DIP of fifth with increased pain over volar wrist sharp pain NOW PA and  RA WNL but composite flexion and OPposition some locking or triggering - tender 5/10 A1pulley Goal status:IMPROVING  4.  Left grip and prehension strength improved to within functional limits for her age for patient to use hand and more than 50% of ADLs without increase symptoms Baseline: Patient with a increase edema and pain and decreased flexion of digits-grip and prehension not assessed patient 2 weeks out of sling.  Per patient PT initiating shoulder strength next week. NOW no grip and prehension strengthening done yet because of carpal tunnel symptoms as well as trigger thumb Goal status:IMPROVING  5.  L wrist and digits AROM increased to within normal limits to perform 75% of ADLs symptom-free Baseline: Decreased wrist and digit active range of motion, increased edema and pain using in ADLs less than 25% Goal status: MET  ASSESSMENT:  CLINICAL IMPRESSION: Patient seen by occupational therapy for left hand edema, numbness, pain and weakness for 9 sessions  Patient had left shoulder surgery with a nerve block on 08/17/2023.    Since evaluation patient is made great progress with edema in left UE, wrist and hand as well as increased digits and wrist range of motion with decreased pain.  Pt show decrease edema, numbness, pain in the left hand and wrist.  Patient active range of motion in wrist and digit within  normal limits.  But continued to report some increased swelling at times in the hand and wrist.  Grip strength about the same but prehension improved but still limited.  Pt still report clicking at thumb and 5/10 tenderness over the thumb A1 pulley,  and numbness improving in tip of thumb -patient continues to be tender over the volar wrist but negative Tinel --patient has a nerve conduction test 11 June.  Seeing her surgeon 2 June.  Patient to continue at home with increased functional use in home program and can follow-up with me in a month.  Patient limited in functional use of left hand in ADLs and IADLs.  Patient can benefit from skilled OT services to monitor progress in ROM , strength , sensory issue and edema - while returning to normal ADL's and IADL's.   Patient can benefit from skilled OT services to increase use of left hand in ADLs and IADLs to return to prior level of function.  PERFORMANCE DEFICITS: in functional skills including ADLs, IADLs, ROM, strength, pain, flexibility, decreased knowledge of use of DME, and UE functional use,   and psychosocial skills including environmental adaptation and routines and behaviors.   IMPAIRMENTS: are limiting patient from ADLs, IADLs, rest and sleep, play, leisure, and social participation.   COMORBIDITIES: has no other co-morbidities that affects occupational performance. Patient will benefit from skilled OT to address above impairments and improve overall function.  MODIFICATION OR ASSISTANCE TO COMPLETE EVALUATION: No modification of tasks or assist necessary to complete an evaluation.  OT OCCUPATIONAL PROFILE AND HISTORY: Problem focused assessment: Including review of records relating to presenting problem.  CLINICAL DECISION MAKING: LOW - limited treatment options, no task modification necessary  REHAB POTENTIAL: Good for goals  EVALUATION COMPLEXITY: Low   PLAN:  OT FREQUENCY:  1 x wk to biweekly and monthly   OT DURATION: 8 wks    PLANNED INTERVENTIONS: 97168 OT Re-evaluation, 97535 self care/ADL training, 11914 therapeutic exercise, 97530 therapeutic activity, 97112 neuromuscular re-education, 97140 manual therapy, 97018 paraffin, 78295 fluidotherapy, 97034 contrast bath, 97760 Splinting (initial encounter), passive range of motion, patient/family education, and DME and/or AE instructions  CONSULTED AND AGREED  WITH PLAN OF CARE: Patient   Heloise Lobo, OTR/L,CLT 12/16/2023, 2:32 PM

## 2023-12-20 ENCOUNTER — Other Ambulatory Visit: Payer: Self-pay | Admitting: Internal Medicine

## 2023-12-20 DIAGNOSIS — K219 Gastro-esophageal reflux disease without esophagitis: Secondary | ICD-10-CM

## 2023-12-23 NOTE — Telephone Encounter (Signed)
 Requested Prescriptions  Pending Prescriptions Disp Refills   omeprazole  (PRILOSEC) 20 MG capsule [Pharmacy Med Name: Omeprazole  20 MG Oral Capsule Delayed Release] 90 capsule 0    Sig: Take 1 capsule by mouth once daily     Gastroenterology: Proton Pump Inhibitors Passed - 12/23/2023 10:58 AM      Passed - Valid encounter within last 12 months    Recent Outpatient Visits           2 months ago Annual physical exam   [facility] at [facility], [place], [place]

## 2023-12-27 DIAGNOSIS — M7522 Bicipital tendinitis, left shoulder: Secondary | ICD-10-CM | POA: Diagnosis not present

## 2023-12-27 DIAGNOSIS — M75122 Complete rotator cuff tear or rupture of left shoulder, not specified as traumatic: Secondary | ICD-10-CM | POA: Diagnosis not present

## 2023-12-27 DIAGNOSIS — M7582 Other shoulder lesions, left shoulder: Secondary | ICD-10-CM | POA: Diagnosis not present

## 2024-01-05 DIAGNOSIS — R200 Anesthesia of skin: Secondary | ICD-10-CM | POA: Diagnosis not present

## 2024-01-10 ENCOUNTER — Ambulatory Visit: Attending: Surgery | Admitting: Occupational Therapy

## 2024-01-10 DIAGNOSIS — M25642 Stiffness of left hand, not elsewhere classified: Secondary | ICD-10-CM | POA: Insufficient documentation

## 2024-01-10 DIAGNOSIS — R600 Localized edema: Secondary | ICD-10-CM | POA: Insufficient documentation

## 2024-01-10 DIAGNOSIS — M25632 Stiffness of left wrist, not elsewhere classified: Secondary | ICD-10-CM | POA: Insufficient documentation

## 2024-01-10 DIAGNOSIS — M6281 Muscle weakness (generalized): Secondary | ICD-10-CM | POA: Insufficient documentation

## 2024-01-10 DIAGNOSIS — M25532 Pain in left wrist: Secondary | ICD-10-CM | POA: Insufficient documentation

## 2024-01-10 NOTE — Therapy (Signed)
 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, estrogen receptor positive (HCC) 2019   a.) stage I RIGHT breast cancer (T1c N1 M0, ER/PR+ HER2/neu -); s/p systemic chemotherapy + XRT + endocrine therapy   Hashimoto's thyroiditis    Hypothyroidism    Long term current use of aromatase inhibitor    a.) anastrozole    Mixed hyperlipidemia    Multinodular goiter    Osteopenia    Personal history of chemotherapy 2019   Rt breast   Personal history of radiation therapy 2019   Rt breast   Raynaud's syndrome    Rheumatoid factor positive    Past Surgical History:  Procedure Laterality Date   BREAST BIOPSY Left    stereo-neg   BREAST BIOPSY Right 2019   BREAST LUMPECTOMY Right 2019   invasive ductal carcinoma   COLONOSCOPY     SHOULDER ARTHROSCOPY WITH SUBACROMIAL DECOMPRESSION, ROTATOR CUFF REPAIR AND BICEP TENDON REPAIR Left 08/17/2023   Procedure: SHOULDER ARTHROSCOPY WITH DEBRIDEMENT, DECOMPRESSION, ROTATOR CUFF REPAIR AND BICEPS TENODESIS.;  Surgeon: Elner Hahn, MD;  Location: [facility] ORS;  Service: Orthopedics;  Laterality: Left;   Patient Active Problem List   Diagnosis Date Noted   Osteopenia 06/02/2023   Genetic testing 07/17/2022   ANA positive 10/30/2020   Rheumatoid factor positive 10/30/2020   Mixed hyperlipidemia 10/05/2020   Raynaud's syndrome 10/04/2020   Multinodular goiter  06/12/2020   Pharyngeal dysphagia 06/12/2020   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 in female, estrogen receptor positive (HCC) 07/28/2018    ONSET DATE: 08/17/23  REFERRING DIAG: L shoulder surgery with increase hand edema , pain and weakness  THERAPY DIAG:  Stiffness of left hand, not elsewhere classified  Stiffness of left wrist, not elsewhere classified  Localized edema  Pain in left wrist  Muscle weakness (generalized)  Rationale for Evaluation and Treatment: Rehabilitation  SUBJECTIVE:   SUBJECTIVE STATEMENT: I have my nerve conduction test.  Dr. Walden Guise said touch of carpal tunnel.  The index finger is better my thumb still feels a little pins-and-needles.  I had been wearing a brace at nighttime.  I can tell her more strength.  Using my hand more in the garden.  I can hold the pot now to scrape it, I can clap hands.  So I can tell the difference. Pt accompanied by: self  PERTINENT HISTORY: [facility] visit note 10/01/23 Skin inspection of the left hand is notable for mild swelling, but otherwise is unremarkable. No erythema, ecchymosis, abrasions, or other skin abnormalities are identified. She has mild tenderness palpation along the ulnar aspect of her wrist, but there are no other areas of tenderness around the wrist or hand. She is able to actively flex extend all digits without any pain or triggering, but is unable to fully flex her thumb, index, and  long fingers. Passively, each of the index and long MCP joints can be flexed to 90 degrees, while each of the index and long PIP joints can be flexed passively to 100 degrees. She is grossly neurovascular intact to all digits, other than some subjectively decreased sensation light touch to the thumb greater than index finger tips.  Assessment: Encounter Diagnoses  Name Primary?  Nontraumatic complete tear of left rotator cuff Yes  Rotator cuff tendinitis, left  Tendinitis of upper biceps tendon of left shoulder    Plan: The treatment options were discussed with the patient and her husband. In addition, patient educational materials were provided regarding the diagnosis and treatment options. Regarding her left shoulder, the patient is pleased with her symptomatic and functional improvement at this time. I have recommended that she continue with physical therapy and home exercises in order to optimize her range of motion, strength, and overall function.  Regarding her left hand/wrist symptoms, most likely the symptoms are due either to the position of her arm in her sling postoperatively or perhaps her nerve being irritated by the nerve block placed preoperative by the anesthesiologist. Regardless, the patient is advised that these symptoms most likely will resolve over time. Meanwhile, the patient will be sent to occupational therapy to work on range of motion and strength exercises to the hand in order to optimize her likelihood of regaining full function. She may progress in her activities as symptoms remain, but is to avoid offending activities. He may take over-the-counter medications as needed for discomfort. All of the patient's questions and concerns were   PRECAUTIONS: No weightbearing post shoulder surgery    WEIGHT BEARING RESTRICTIONS: Nonweightbearing left upper extremity  PAIN:  Are you having pain?  Tenderness over left thumb MCP 5/10 FALLS: Has patient fallen in last 6 months? Yes. Number of falls 1  LIVING ENVIRONMENT: Lives with: lives with their spouse   PLOF: Patient is retired but very active playing the [employer], working the yard, gardening, Archivist and crocheting  PATIENT GOALS: I want to be able to use my hand.  Make a fist get the numbness and swelling better.  NEXT MD VISIT: Next month  OBJECTIVE:  Note: Objective measures were completed at Evaluation unless otherwise noted.  HAND DOMINANCE: Right  FUNCTIONAL OUTCOME MEASURES: To be assessed next session  UPPER  EXTREMITY ROM:     Active ROM Right eval Left eval L 10/26/23 L 10/29/23 L 11/11/23 L 11/18/23  Shoulder flexion        Shoulder abduction        Shoulder adduction        Shoulder extension        Shoulder internal rotation        Shoulder external rotation        Elbow flexion        Elbow extension        Wrist flexion  70 volar wrist pain 90 90 pain over dorsal wrist  90 90  Wrist extension  65 dorsal wrist pain 70 pull dorsal wrist 70 pull over dorsal wrist  75 70  Wrist ulnar deviation  30 volar wrist pain 40 pain volar wrist  40 40 pull volar wrist  40  Wrist radial deviation  20 volar wrist pain 25 volar wrist  25 25 25   Wrist pronation    90 90 90  Wrist supination    90 90 pain with resistance 90  (Blank rows = not tested)  Active ROM Right  eval Left eval L 10/26/23 L 10/29/23 L 11/11/23 L 11/18/23 L 11/22/23 L 12/16/23  Thumb MCP (0-60)       60 60  Thumb IP (0-80)      Little click per pt 45 R 75 55/R 75  Thumb Radial abd/add (0-55)  50  WNL   Pain  volar MC decrease  Pain  volar MC decrease  5/10 volar MC  Thumb Palmar abd/add (0-45)   50 WN   WNL WNL   Thumb Opposition to Small Finger  Sharp volar wrist pain with opposition to fifth 2nd fold pain volar wrist    Pain at volar thumb MC with opposition    Index MCP (0-90)  70  80 85 85     Index PIP (0-100)   90 95 95 100     Index DIP (0-70)            Long MCP (0-90)    70 85 85 90     Long PIP (0-100)    85 95 100 100     Long DIP (0-70)            Ring MCP (0-90)    50 85 90 90     Ring PIP (0-100)    90 95 95 100     Ring DIP (0-70)            Little MCP (0-90)    40 90 90 90     Little PIP (0-100)    90 100 100 100     Little DIP (0-70)            (Blank rows = not tested)    HAND FUNCTION: 10/26/23 Grip strength: Right: NT lbs; Left: NT lbs, Lateral pinch: Right: 18 lbs, Left: 6 lbs, and 3 point pinch: Right: 17 lbs, Left: 5 lbs 10/29/23 Grip strength: Right: NT lbs; Left: NT lbs, Lateral pinch: Right: 18 lbs,  Left: 8 lbs, and 3 point pinch: Right: 17 lbs, Left: 8 lbs 11/11/23 Grip strength: Right: 65 lbs; Left: 42 lbs, Lateral pinch: Right: 18 lbs, Left: 13 lbs, and 3 point pinch: Right: 17 lbs, Left:10 lbs pain thumb volar MC with 3 point 12/06/23 Grip strength: Right: 65 lbs; Left: 42 lbs, Lateral pinch: Right: 18 lbs, Left: 13 lbs, and 3 point pinch: Right: 17 lbs, Left:11 lbs  12/16/23 Grip strength: Right: 66 lbs; Left: 40 lbs, Lateral pinch: Right: 19 lbs, Left: 15 lbs, and 3 point pinch: Right: 19lbs, Left:14 lbs  01/10/24 Grip strength: Right: 66 lbs; Left: 40 lbs, Lateral pinch: Right: 19 lbs, Left: 15 lbs, and 3 point pinch: Right: 19lbs, Left:14 lbs   COORDINATION: Decreased because of increased edema and stiffness and pain as well as numbness at eval 10/26/23 assess individual tendon glides as well as able to do different signs of digits.  Difficulty initiate as well as opposition from palm to fingertips difficulty mostly with fourth and fifth.  SENSATION: At eval patient report numbness in most of the thumb.  In the tips of second and third digits  EDEMA: Increase edema in the volar wrist as well as in the hand on the left  10/29/23 patient report increased fullness and heaviness in the upper arm on the left.  Since surgery. See lymphedema flowsheet for circumferences  COGNITION: Overall cognitive status: Within functional limits for tasks assessed   TREATMENT DATE: 01/10/24     Patient return after not being seen for a month.  Patient was able to maintain circumference of left upper extremity. She was able to maintain digit flexion. Thumb palmar abduction. Palmar radial abduction decreased provided patient with stretches. Patient to continue with palmar abduction with rubber band. Patient wrist extension 70 degrees Patient wrist flexion increased discomfort and pain over the dorsal wrist. Reviewed with patient to continue in the morning in the shower wrist composite flexion extension  stretches.  Because of wearing a brace at nighttime. Patient continues to have some swelling over the volar wrist.  Recommend for patient to continue if she is using her hand a lot in the yard to do contrast as well as her Isotoner glove on at the carpal tunnel brace. Continue to maintain her thumb composite flexion as well as palmar radial abduction. And wrist flexion extension.  Recommended in the past for her to modify her activities in the yard work at home as well as enlarging her grips using larger joints.    PATIENT EDUCATION: Education details: findings of eval and HEP  Person educated: Patient Education method: Explanation, Demonstration, Tactile cues, Verbal cues, and Handouts Education comprehension: verbalized understanding, returned demonstration, verbal cues required, and needs further education  GOALS: Goals reviewed with patient? Yes  LONG TERM GOALS: Target date: 8 wks   Patient to be independent home program to increase digit flexion to touching palm without increase symptoms Baseline: Patient present with increased edema in hand and volar wrist with MC flexion 40 to 70 degrees and PIP 85-90 Goal status:MET  2.  Left wrist active range of motion increased to within normal limits symptom-free for patient to use left hand in bathing and dressing Baseline: Left wrist extension 65 and flexion 70 degrees with increased pain in volar and dorsal forearm.  Radial ulnar deviation active range of motion within normal limits but increased pain over volar wrist Goal status:MET  3.  Thumb active range of motion all planes improved to within normal limits for patient to do buttons, open containers and use utensils and brush. Baseline: Thumb palmar radial abduction 50 degrees but increased tightness with pull.  Opposition to DIP of fifth with increased pain over volar wrist sharp pain NOW PA and RA WNL -patient to do passive range of motion for radial abduction.  And continue  strengthening for palmar abduction continue with stretches to maintain composite flexion Goal status: Met  4.  Left grip and prehension strength improved to within functional limits for her age for patient to use hand and more than 50% of ADLs without increase symptoms Baseline: Patient with a increase edema and pain and decreased flexion of digits-grip and prehension not assessed patient 2 weeks out of sling.  Per patient PT initiating shoulder strength next week.  Grip improved 8 pounds since last time and 3 point pinch 1 pound.  Goal status: Met  5.  L wrist and digits AROM increased to within normal limits to perform 75% of ADLs symptom-free Baseline: Decreased wrist and digit active range of motion, increased edema and pain using in ADLs less than 25% since last visit patient started wearing carpal tunnel brace.  Patient with discomfort with wrist flexion as well as composite flexion.  Reinforced with patient to do wrist stretches for flexion extension in the morning. Goal status: Partially  ASSESSMENT:  CLINICAL IMPRESSION: Patient seen by occupational therapy for left hand edema, numbness, pain and weakness for 9 sessions  Patient had left shoulder surgery with a nerve block on 08/17/2023.    Since evaluation patient  is made great progress with edema in left UE, wrist and hand as well as increased digits and wrist range of motion with decreased pain.  Pt show decrease edema, numbness, pain in the left hand and wrist.  Patient active range of motion in wrist and digit within normal limits.  Patient seen orthopedics June 2.  Had a nerve conduction test 11 June.  That showed minimal carpal tunnel.  Since then patient had been wearing her wrist brace.  At nighttime.  Patient is following up after doing home exercises for a month.  Patient continues to have some pins-and-needles in the thumb.  Patient do show decreased wrist flexion more than extension.  Reinforced with her to do stretches in the  morning because of immobilization at nighttime.  Patient also to continue with strengthening for thumb palmar abduction and stretches for radial abduction and composite flexion of thumb-denies any triggering.  Patient continued to have positive Tinel over the volar wrist.  Recommended for patient to do contrast at the end of the day after work in the garden.  Can do Isotoner glove in combination with her wrist brace.  Patient was also educated in the past about modifications with her yard work as well as tools and using larger joints and enlarging grips.  Patient discharged at this time.  PERFORMANCE DEFICITS: in functional skills including ADLs, IADLs, ROM, strength, pain, flexibility, decreased knowledge of use of DME, and UE functional use,   and psychosocial skills including environmental adaptation and routines and behaviors.   IMPAIRMENTS: are limiting patient from ADLs, IADLs, rest and sleep, play, leisure, and social participation.   COMORBIDITIES: has no other co-morbidities that affects occupational performance. Patient will benefit from skilled OT to address above impairments and improve overall function.  MODIFICATION OR ASSISTANCE TO COMPLETE EVALUATION: No modification of tasks or assist necessary to complete an evaluation.  OT OCCUPATIONAL PROFILE AND HISTORY: Problem focused assessment: Including review of records relating to presenting problem.  CLINICAL DECISION MAKING: LOW - limited treatment options, no task modification necessary  REHAB POTENTIAL: Good for goals  EVALUATION COMPLEXITY: Low   PLAN:  OT FREQUENCY:  1 x wk to biweekly and monthly   OT DURATION: 8 wks   PLANNED INTERVENTIONS: 97168 OT Re-evaluation, 97535 self care/ADL training, 44034 therapeutic exercise, 97530 therapeutic activity, 97112 neuromuscular re-education, 97140 manual therapy, 97018 paraffin, 74259 fluidotherapy, 97034 contrast bath, 97760 Splinting (initial encounter), passive range of motion,  patient/family education, and DME and/or AE instructions  CONSULTED AND AGREED WITH PLAN OF CARE: Patient   Heloise Lobo, OTR/L,CLT 01/10/2024, 9:40 AM

## 2024-02-07 DIAGNOSIS — M7582 Other shoulder lesions, left shoulder: Secondary | ICD-10-CM | POA: Diagnosis not present

## 2024-02-07 DIAGNOSIS — G5602 Carpal tunnel syndrome, left upper limb: Secondary | ICD-10-CM | POA: Diagnosis not present

## 2024-02-07 DIAGNOSIS — M7522 Bicipital tendinitis, left shoulder: Secondary | ICD-10-CM | POA: Diagnosis not present

## 2024-02-07 DIAGNOSIS — M75122 Complete rotator cuff tear or rupture of left shoulder, not specified as traumatic: Secondary | ICD-10-CM | POA: Diagnosis not present

## 2024-02-15 ENCOUNTER — Encounter: Admitting: Occupational Therapy

## 2024-03-11 ENCOUNTER — Other Ambulatory Visit: Payer: Self-pay | Admitting: Internal Medicine

## 2024-03-11 DIAGNOSIS — K219 Gastro-esophageal reflux disease without esophagitis: Secondary | ICD-10-CM

## 2024-03-14 NOTE — Telephone Encounter (Signed)
 Requested Prescriptions  Pending Prescriptions Disp Refills   omeprazole  (PRILOSEC) 20 MG capsule [Pharmacy Med Name: Omeprazole  20 MG Oral Capsule Delayed Release] 90 capsule 0    Sig: Take 1 capsule by mouth once daily     Gastroenterology: Proton Pump Inhibitors Passed - 03/14/2024 12:53 PM      Passed - Valid encounter within last 12 months    Recent Outpatient Visits           4 months ago Annual physical exam   [facility] at [facility], Leita DEL, MD

## 2024-03-22 ENCOUNTER — Ambulatory Visit: Payer: 59 | Admitting: Emergency Medicine

## 2024-03-22 VITALS — Ht 65.5 in | Wt 190.0 lb

## 2024-03-22 DIAGNOSIS — Z0000 Encounter for general adult medical examination without abnormal findings: Secondary | ICD-10-CM

## 2024-03-22 NOTE — Patient Instructions (Signed)
 Alexa Knight , Thank you for taking time out of your busy schedule to complete your Annual Wellness Visit with me. I enjoyed our conversation and look forward to speaking with you again next year. I, as well as your care team,  appreciate your ongoing commitment to your health goals. Please review the following plan we discussed and let me know if I can assist you in the future. Your Game plan/ To Do List    Referrals: None   Follow up Visits: You're next Medicare AWV will be due after 03/23/25 with your new PCP. Have you seen your provider in the last 6 months (3 months if uncontrolled diabetes)? Yes  Clinician Recommendations: Get a routine eye exam every 1-2 years. I have included a list of eye doctors in the area.  Aim for 30 minutes of exercise or brisk walking, 6-8 glasses of water, and 5 servings of fruits and vegetables each day.       This is a list of the screenings recommended for you:  Health Maintenance  Topic Date Due   COVID-19 Vaccine (3 - Pfizer risk series) 01/05/2020   DTaP/Tdap/Td vaccine (1 - Tdap) 03/20/2023   Stool Blood Test  10/15/2023   Flu Shot  10/24/2024*   Mammogram  10/26/2024   Medicare Annual Wellness Visit  03/22/2025   Cologuard (Stool DNA test)  11/01/2026   DEXA scan (bone density measurement)  10/26/2027   Pneumococcal Vaccine for age over 30  Completed   Hepatitis C Screening  Completed   Zoster (Shingles) Vaccine  Completed   HPV Vaccine  Aged Out   Meningitis B Vaccine  Aged Out  *Topic was postponed. The date shown is not the original due date.    Advanced directives: (In Chart) A copy of your advanced directives are scanned into your chart should your provider ever need it. Advance Care Planning is important because it:  [x]  Makes sure you receive the medical care that is consistent with your values, goals, and preferences  [x]  It provides guidance to your family and loved ones and reduces their decisional burden about whether or not they  are making the right decisions based on your wishes.  Follow the link provided in your after visit summary or read over the paperwork we have mailed to you to help you started getting your Advance Directives in place. If you need assistance in completing these, please reach out to us  so that we can help you!  See attachments for Preventive Care and Fall Prevention Tips.   There are several Eye Doctors in your area. Here are a few that usually accept all insurance types:  [facility] [address] [place], [place] [address] Phone: [phone]  [facility] [address] [place], [place] [address] Phone: [phone]  [facility] [address] [place], [place] [address] Phone: [phone]  [facility] [address] [place], [place] [address] Phone: [phone]  [facility] [address] [place], [place] [address] Phone: [phone]  [facility] [address] [place], [place] [address] Phone: [phone]  Please let us  know if you require a referral for an eye exam appointment. Thank you!    Fall Prevention in the Home, Adult Falls can cause injuries and affect people of all ages. There are many simple things that you can do to make your home safe and to help prevent falls. If you need it, ask for help making these changes. What actions can  I take to prevent falls? General information Use good lighting in all rooms. Make sure to: Replace any light bulbs that burn out. Turn on lights if it is dark and use night-lights. Keep items that you use often in easy-to-reach places. Lower the shelves around your home if needed. Move furniture so that there are clear paths around it. Do not keep throw rugs or other things on the floor that can make you trip. If any of your floors are uneven, fix them. Add color or contrast paint or tape to clearly mark and help you see: Grab bars or handrails. First and last steps of staircases. Where the edge of each  step is. If you use a ladder or stepladder: Make sure that it is fully opened. Do not climb a closed ladder. Make sure the sides of the ladder are locked in place. Have someone hold the ladder while you use it. Know where your pets are as you move through your home. What can I do in the bathroom?     Keep the floor dry. Clean up any water that is on the floor right away. Remove soap buildup in the bathtub or shower. Buildup makes bathtubs and showers slippery. Use non-skid mats or decals on the floor of the bathtub or shower. Attach bath mats securely with double-sided, non-slip rug tape. If you need to sit down while you are in the shower, use a non-slip stool. Install grab bars by the toilet and in the bathtub and shower. Do not use towel bars as grab bars. What can I do in the bedroom? Make sure that you have a light by your bed that is easy to reach. Do not use any sheets or blankets on your bed that hang to the floor. Have a firm bench or chair with side arms that you can use for support when you get dressed. What can I do in the kitchen? Clean up any spills right away. If you need to reach something above you, use a sturdy step stool that has a grab bar. Keep electrical cables out of the way. Do not use floor polish or wax that makes floors slippery. What can I do with my stairs? Do not leave anything on the stairs. Make sure that you have a light switch at the top and the bottom of the stairs. Have them installed if you do not have them. Make sure that there are handrails on both sides of the stairs. Fix handrails that are broken or loose. Make sure that handrails are as long as the staircases. Install non-slip stair treads on all stairs in your home if they do not have carpet. Avoid having throw rugs at the top or bottom of stairs, or secure the rugs with carpet tape to prevent them from moving. Choose a carpet design that does not hide the edge of steps on the stairs. Make  sure that carpet is firmly attached to the stairs. Fix any carpet that is loose or worn. What can I do on the outside of my home? Use bright outdoor lighting. Repair the edges of walkways and driveways and fix any cracks. Clear paths of anything that can make you trip, such as tools or rocks. Add color or contrast paint or tape to clearly mark and help you see high doorway thresholds. Trim any bushes or trees on the main path into your home. Check that handrails are securely fastened and in good repair. Both sides of all steps should have handrails. Install  guardrails along the edges of any raised decks or porches. Have leaves, snow, and ice cleared regularly. Use sand, salt, or ice melt on walkways during winter months if you live where there is ice and snow. In the garage, clean up any spills right away, including grease or oil spills. What other actions can I take? Review your medicines with your health care provider. Some medicines can make you confused or feel dizzy. This can increase your chance of falling. Wear closed-toe shoes that fit well and support your feet. Wear shoes that have rubber soles and low heels. Use a cane, walker, scooter, or crutches that help you move around if needed. Talk with your provider about other ways that you can decrease your risk of falls. This may include seeing a physical therapist to learn to do exercises to improve movement and strength. Where to find more information Centers for Disease Control and Prevention, STEADI: TonerPromos.no [employer] on Aging: BaseRingTones.pl National Institute on Aging: BaseRingTones.pl Contact a health care provider if: You are afraid of falling at home. You feel weak, drowsy, or dizzy at home. You fall at home. Get help right away if you: Lose consciousness or have trouble moving after a fall. Have a fall that causes a head injury. These symptoms may be an emergency. Get help right away. Call 911. Do not wait to see if the  symptoms will go away. Do not drive yourself to the hospital. This information is not intended to replace advice given to you by your health care provider. Make sure you discuss any questions you have with your health care provider. Document Revised: 03/16/2022 Document Reviewed: 03/16/2022 Elsevier Patient Education  2024 [employer].

## 2024-03-22 NOTE — Progress Notes (Signed)
 Subjective:   Alexa Knight is a 68 y.o. who presents for a Medicare Wellness preventive visit.  As a reminder, Annual Wellness Visits don't include a physical exam, and some assessments may be limited, especially if this visit is performed virtually. We may recommend an in-person follow-up visit with your provider if needed.  Visit Complete: Virtual I connected with  Sherrilyn Herald on 03/22/24 by a audio enabled telemedicine application and verified that I am speaking with the correct person using two identifiers.  Patient Location: Home  Provider Location: Home Office  I discussed the limitations of evaluation and management by telemedicine. The patient expressed understanding and agreed to proceed.  Vital Signs: Because this visit was a virtual/telehealth visit, some criteria may be missing or patient reported. Any vitals not documented were not able to be obtained and vitals that have been documented are patient reported.  VideoDeclined- This patient declined Librarian, academic. Therefore the visit was completed with audio only.  Persons Participating in Visit: Patient.  AWV Questionnaire: Yes: Patient Medicare AWV questionnaire was completed by the patient on 03/21/24; I have confirmed that all information answered by patient is correct and no changes since this date.  Cardiac Risk Factors include: advanced age (>50men, >82 women);dyslipidemia;obesity (BMI >30kg/m2)     Objective:    Today's Vitals   03/22/24 0843  Weight: 190 lb (86.2 kg)  Height: 5' 5.5 (1.664 m)   Body mass index is 31.14 kg/m.     03/22/2024    8:53 AM 08/17/2023    8:14 AM 08/11/2023    1:35 PM 06/02/2023   11:00 AM 03/17/2023    8:59 AM 12/07/2022   10:58 AM 06/08/2022   12:59 PM  Advanced Directives  Does Patient Have a Medical Advance Directive? Yes No;Yes Yes No No No No  Type of Estate agent of [place];Living will Healthcare Power of  [place];Living will Healthcare Power of [place];Living will      Does patient want to make changes to medical advance directive? No - Patient declined No - Patient declined       Copy of Healthcare Power of Attorney in Chart? No - copy requested No - copy requested No - copy requested      Would patient like information on creating a medical advance directive?    No - Patient declined No - Patient declined No - Patient declined No - Patient declined    Current Medications (verified) Outpatient Encounter Medications as of 03/22/2024  Medication Sig   anastrozole  (ARIMIDEX ) 1 MG tablet Take 1 tablet (1 mg total) by mouth daily.   Calcium Carb-Cholecalciferol (CALCIUM 600 + D PO) Take 2 tablets by mouth daily.   levothyroxine (SYNTHROID) 100 MCG tablet Take 100 mcg by mouth daily before breakfast.    Multiple Vitamin (MULTI-VITAMIN DAILY PO) Take 1 tablet by mouth daily.   omeprazole  (PRILOSEC) 20 MG capsule Take 1 capsule by mouth once daily   meloxicam (MOBIC) 15 MG tablet Take 15 mg by mouth daily.   No facility-administered encounter medications on file as of 03/22/2024.    Allergies (verified) Patient has no known allergies.   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, estrogen receptor positive (HCC) 2019   a.) stage I RIGHT breast cancer (T1c N1 M0, ER/PR+ HER2/neu -); s/p systemic chemotherapy + XRT + endocrine therapy   Hashimoto's thyroiditis    Hypothyroidism    Long term current  use of aromatase inhibitor    a.) anastrozole    Mixed hyperlipidemia    Multinodular goiter    Osteopenia    Personal history of chemotherapy 2019   Rt breast   Personal history of radiation therapy 2019   Rt breast   Raynaud's syndrome    Rheumatoid factor positive    Past Surgical History:  Procedure Laterality Date   BREAST BIOPSY Left    stereo-neg   BREAST BIOPSY Right 2019   BREAST LUMPECTOMY Right 2019   invasive ductal  carcinoma   COLONOSCOPY     SHOULDER ARTHROSCOPY WITH SUBACROMIAL DECOMPRESSION, ROTATOR CUFF REPAIR AND BICEP TENDON REPAIR Left 08/17/2023   Procedure: SHOULDER ARTHROSCOPY WITH DEBRIDEMENT, DECOMPRESSION, ROTATOR CUFF REPAIR AND BICEPS TENODESIS.;  Surgeon: Edie Norleen PARAS, MD;  Location: [facility] ORS;  Service: Orthopedics;  Laterality: Left;   Family History  Problem Relation Age of Onset   Breast cancer Mother 57       d. recurrence 76   Cancer Mother    Heart attack Father    Prostate cancer Brother    Prostate cancer Brother    Breast cancer Maternal Aunt        dx 68s   Cancer Maternal Aunt    Cancer Paternal Aunt        unk type   Social History   Socioeconomic History   Marital status: Married    Spouse name: Gaither Semen   Number of children: 2   Years of education: Not on file   Highest education level: GED or equivalent  Occupational History   Occupation: retired  Tobacco Use   Smoking status: Former    Current packs/day: 0.00    Average packs/day: 1 pack/day for 26.0 years (26.0 ttl pk-yrs)    Types: Cigarettes    Start date: 15    Quit date: 2000    Years since quitting: 25.6   Smokeless tobacco: Never  Vaping Use   Vaping status: Never Used  Substance and Sexual Activity   Alcohol use: Yes    Alcohol/week: 1.0 standard drink of alcohol    Types: 1 Standard drinks or equivalent per week    Comment: 1 cocktail once per week or less   Drug use: Never   Sexual activity: Not Currently    Partners: Male    Birth control/protection: Post-menopausal  Other Topics Concern   Not on file  Social History Narrative   Not on file   Social Drivers of Health   Financial Resource Strain: Low Risk  (03/22/2024)   Overall Financial Resource Strain (CARDIA)    Difficulty of Paying Living Expenses: Not hard at all  Food Insecurity: No Food Insecurity (03/22/2024)   Hunger Vital Sign    Worried About Running Out of Food in the Last Year: Never true    Ran Out of Food  in the Last Year: Never true  Transportation Needs: No Transportation Needs (03/22/2024)   PRAPARE - Administrator, Civil Service (Medical): No    Lack of Transportation (Non-Medical): No  Physical Activity: Insufficiently Active (03/22/2024)   Exercise Vital Sign    Days of Exercise per Week: 1 day    Minutes of Exercise per Session: 10 min  Stress: No Stress Concern Present (03/22/2024)   [employer] of Occupational Health - Occupational Stress Questionnaire    Feeling of Stress: Not at all  Social Connections: Socially Integrated (03/22/2024)   Social Connection and Isolation Panel    Frequency  of Communication with Friends and Family: More than three times a week    Frequency of Social Gatherings with Friends and Family: Three times a week    Attends Religious Services: More than 4 times per year    Active Member of Clubs or Organizations: Yes    Attends Engineer, structural: More than 4 times per year    Marital Status: Married    Tobacco Counseling Counseling given: Not Answered    Clinical Intake:  Pre-visit preparation completed: Yes  Pain : No/denies pain     BMI - recorded: 31.14 Nutritional Status: BMI > 30  Obese Nutritional Risks: None Diabetes: No  No results found for: HGBA1C   How often do you need to have someone help you when you read instructions, pamphlets, or other written materials from your doctor or pharmacy?: 1 - Never  Interpreter Needed?: No  Information entered by :: Vina Ned, CMA   Activities of Daily Living     03/22/2024    8:44 AM 08/11/2023    2:06 PM  In your present state of health, do you have any difficulty performing the following activities:  Hearing? 0   Vision? 0   Difficulty concentrating or making decisions? 0   Walking or climbing stairs? 0   Dressing or bathing? 0   Doing errands, shopping? 0 0  Preparing Food and eating ? N   Using the Toilet? N   In the past six months, have you  accidently leaked urine? N   Do you have problems with loss of bowel control? N   Managing your Medications? N   Managing your Finances? N   Housekeeping or managing your Housekeeping? N     Patient Care Team: Justus Leita DEL, MD as PCP - General (Internal Medicine) Babara Call, MD as Consulting Physician (Hematology and Oncology) Damian Therisa HERO, MD as Physician Assistant (Endocrinology) Poggi, Norleen PARAS, MD as Consulting Physician (Orthopedic Surgery)  I have updated your Care Teams any recent Medical Services you may have received from other providers in the past year.     Assessment:   This is a routine wellness examination for [place].  Hearing/Vision screen Hearing Screening - Comments:: Denies hearing loss  Vision Screening - Comments:: Needs eye exam, included list of eye doctors in [place]   Goals Addressed               This Visit's Progress     Increase physical activity (pt-stated)         Depression Screen     03/22/2024    8:50 AM 10/18/2023    8:48 AM 03/17/2023    8:57 AM 10/14/2022    8:40 AM 10/08/2021    8:42 AM 03/07/2021    7:56 AM 10/04/2020    8:34 AM  PHQ 2/9 Scores  PHQ - 2 Score 0 0 0 0 0 0 0  PHQ- 9 Score 0 0 0 0 0 0 0    Fall Risk     03/22/2024    8:54 AM 10/18/2023    8:48 AM 03/17/2023    9:00 AM 03/13/2023   10:31 AM 10/14/2022    8:40 AM  Fall Risk   Falls in the past year? 0 0 1 1 0  Number falls in past yr: 0 0 0 0 0  Injury with Fall? 0 0 0 0 0  Risk for fall due to : No Fall Risks No Fall Risks History of fall(s)  No  Fall Risks  Follow up Falls evaluation completed Falls evaluation completed Falls prevention discussed;Falls evaluation completed  Falls evaluation completed    MEDICARE RISK AT HOME:  Medicare Risk at Home Any stairs in or around the home?: Yes If so, are there any without handrails?: No Home free of loose throw rugs in walkways, pet beds, electrical cords, etc?: Yes Adequate lighting in your home to reduce risk of  falls?: Yes Life alert?: No Use of a cane, walker or w/c?: No Grab bars in the bathroom?: No Shower chair or bench in shower?: Yes Elevated toilet seat or a handicapped toilet?: Yes  TIMED UP AND GO:  Was the test performed?  No  Cognitive Function: 6CIT completed        03/22/2024    8:56 AM 03/17/2023    9:01 AM  6CIT Screen  What Year? 0 points 0 points  What month? 0 points 0 points  What time? 0 points 0 points  Count back from 20 0 points 0 points  Months in reverse 0 points 0 points  Repeat phrase 0 points 0 points  Total Score 0 points 0 points    Immunizations Immunization History  Administered Date(s) Administered   Influenza,inj,Quad PF,6+ Mos 05/30/2018, 06/12/2020   PFIZER(Purple Top)SARS-COV-2 Vaccination 11/17/2019, 12/08/2019   PNEUMOCOCCAL CONJUGATE-20 10/14/2022   Td (Adult),5 Lf Tetanus Toxid, Preservative Free 03/19/2023   Zoster Recombinant(Shingrix ) 10/08/2021, 01/28/2022    Screening Tests Health Maintenance  Topic Date Due   COVID-19 Vaccine (3 - Pfizer risk series) 01/05/2020   DTaP/Tdap/Td (1 - Tdap) 03/20/2023   COLON CANCER SCREENING ANNUAL FOBT  10/15/2023   INFLUENZA VACCINE  10/24/2024 (Originally 02/25/2024)   MAMMOGRAM  10/26/2024   Medicare Annual Wellness (AWV)  03/22/2025   Fecal DNA (Cologuard)  11/01/2026   DEXA SCAN  10/26/2027   Pneumococcal Vaccine: 50+ Years  Completed   Hepatitis C Screening  Completed   Zoster Vaccines- Shingrix   Completed   HPV VACCINES  Aged Out   Meningococcal B Vaccine  Aged Out    Health Maintenance  Health Maintenance Due  Topic Date Due   COVID-19 Vaccine (3 - Pfizer risk series) 01/05/2020   DTaP/Tdap/Td (1 - Tdap) 03/20/2023   COLON CANCER SCREENING ANNUAL FOBT  10/15/2023   Health Maintenance Items Addressed: See Nurse Notes at the end of this note  Additional Screening:  Vision Screening: Recommended annual ophthalmology exams for early detection of glaucoma and other disorders of  the eye. Would you like a referral to an eye doctor? No    Dental Screening: Recommended annual dental exams for proper oral hygiene  Community Resource Referral / Chronic Care Management: CRR required this visit?  No   CCM required this visit?  No   Plan:    I have personally reviewed and noted the following in the patient's chart:   Medical and social history Use of alcohol, tobacco or illicit drugs  Current medications and supplements including opioid prescriptions. Patient is not currently taking opioid prescriptions. Functional ability and status Nutritional status Physical activity Advanced directives List of other physicians Hospitalizations, surgeries, and ER visits in previous 12 months Vitals Screenings to include cognitive, depression, and falls Referrals and appointments  In addition, I have reviewed and discussed with patient certain preventive protocols, quality metrics, and best practice recommendations. A written personalized care plan for preventive services as well as general preventive health recommendations were provided to patient.   Vina Ned, CMA   03/22/2024  After Visit Summary: (MyChart) Due to this being a telephonic visit, the after visit summary with patients personalized plan was offered to patient via MyChart   Notes:  Needs routine eye exam. Included list of eye doctors in [place] Declined flu and covid vaccines Declined scheduling OV or AWV for 2026. Patient states she is going to get a new PCP in [place].

## 2024-04-10 DIAGNOSIS — K080 Exfoliation of teeth due to systemic causes: Secondary | ICD-10-CM | POA: Diagnosis not present

## 2024-06-03 ENCOUNTER — Other Ambulatory Visit: Payer: Self-pay | Admitting: Oncology

## 2024-06-03 DIAGNOSIS — C50411 Malignant neoplasm of upper-outer quadrant of right female breast: Secondary | ICD-10-CM

## 2024-06-05 ENCOUNTER — Inpatient Hospital Stay: Attending: Oncology

## 2024-06-05 ENCOUNTER — Inpatient Hospital Stay: Admitting: Oncology

## 2024-06-05 ENCOUNTER — Encounter: Payer: Self-pay | Admitting: Oncology

## 2024-06-05 VITALS — BP 117/84 | HR 66 | Temp 96.3°F | Resp 18 | Wt 194.2 lb

## 2024-06-05 DIAGNOSIS — Z87891 Personal history of nicotine dependence: Secondary | ICD-10-CM | POA: Insufficient documentation

## 2024-06-05 DIAGNOSIS — Z803 Family history of malignant neoplasm of breast: Secondary | ICD-10-CM

## 2024-06-05 DIAGNOSIS — M8580 Other specified disorders of bone density and structure, unspecified site: Secondary | ICD-10-CM

## 2024-06-05 DIAGNOSIS — Z170 Estrogen receptor positive status [ER+]: Secondary | ICD-10-CM | POA: Diagnosis not present

## 2024-06-05 DIAGNOSIS — C50411 Malignant neoplasm of upper-outer quadrant of right female breast: Secondary | ICD-10-CM | POA: Insufficient documentation

## 2024-06-05 DIAGNOSIS — Z809 Family history of malignant neoplasm, unspecified: Secondary | ICD-10-CM

## 2024-06-05 LAB — CBC WITH DIFFERENTIAL (CANCER CENTER ONLY)
Abs Immature Granulocytes: 0.02 K/uL (ref 0.00–0.07)
Basophils Absolute: 0.1 K/uL (ref 0.0–0.1)
Basophils Relative: 1 %
Eosinophils Absolute: 0.2 K/uL (ref 0.0–0.5)
Eosinophils Relative: 4 %
HCT: 38.7 % (ref 36.0–46.0)
Hemoglobin: 12.8 g/dL (ref 12.0–15.0)
Immature Granulocytes: 0 %
Lymphocytes Relative: 34 %
Lymphs Abs: 1.6 K/uL (ref 0.7–4.0)
MCH: 29.6 pg (ref 26.0–34.0)
MCHC: 33.1 g/dL (ref 30.0–36.0)
MCV: 89.4 fL (ref 80.0–100.0)
Monocytes Absolute: 0.5 K/uL (ref 0.1–1.0)
Monocytes Relative: 11 %
Neutro Abs: 2.4 K/uL (ref 1.7–7.7)
Neutrophils Relative %: 50 %
Platelet Count: 252 K/uL (ref 150–400)
RBC: 4.33 MIL/uL (ref 3.87–5.11)
RDW: 12.9 % (ref 11.5–15.5)
WBC Count: 4.8 K/uL (ref 4.0–10.5)
nRBC: 0 % (ref 0.0–0.2)

## 2024-06-05 LAB — CMP (CANCER CENTER ONLY)
ALT: 26 U/L (ref 0–44)
AST: 31 U/L (ref 15–41)
Albumin: 3.7 g/dL (ref 3.5–5.0)
Alkaline Phosphatase: 91 U/L (ref 38–126)
Anion gap: 8 (ref 5–15)
BUN: 11 mg/dL (ref 8–23)
CO2: 26 mmol/L (ref 22–32)
Calcium: 9.1 mg/dL (ref 8.9–10.3)
Chloride: 103 mmol/L (ref 98–111)
Creatinine: 0.73 mg/dL (ref 0.44–1.00)
GFR, Estimated: >60 mL/min (ref >60)
Glucose, Bld: 98 mg/dL (ref 70–99)
Potassium: 4.1 mmol/L (ref 3.5–5.1)
Sodium: 137 mmol/L (ref 135–145)
Total Bilirubin: 0.8 mg/dL (ref 0.0–1.2)
Total Protein: 7.5 g/dL (ref 6.5–8.1)

## 2024-06-05 MED ORDER — ANASTROZOLE 1 MG PO TABS: 1.0000 mg | ORAL_TABLET | Freq: Every day | ORAL | 1 refills | Status: AC

## 2024-06-05 NOTE — Assessment & Plan Note (Addendum)
 10/26/2022 DEXA showed osteopenia. FRAX  10 year major pathological fracture 8.2% Recommend patient to take calcium and vitamin D supplementation.  Repeat DEXA in 2026

## 2024-06-05 NOTE — Progress Notes (Signed)
 Hematology/Oncology Progress note [phone] [phone]        REFERRING PROVIDER: Justus Leita DEL, MD    CHIEF COMPLAINTS/PURPOSE OF CONSULTATION:  Right Breast cancer.   ASSESSMENT & PLAN:   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 in female, estrogen receptor positive (HCC) Stage I right breast cancer, T1c N1 M0 ER+, PR+ HER2 negative.  S/p lumpectomy w SLNB, adjuvant chemotherapy and radiation.  Recommend patient to continue endocrine therapy with Arimidex  1mg  daily for at least 5 years [started in July 2020]. Share decision was made to extend therapy beyond 5 year. .  Arimidex  Rx was refilled.  Continue annual screening mammogram, next due April 2026  Osteopenia 10/26/2022 DEXA showed osteopenia. FRAX  10 year major pathological fracture 8.2% Recommend patient to take calcium and vitamin D supplementation.  Repeat DEXA in 2026   Orders Placed This Encounter  Procedures   MM 3D SCREENING MAMMOGRAM BILATERAL BREAST    Standing Status:   Future    Expected Date:   11/09/2024    Expiration Date:   06/05/2025    Reason for Exam (SYMPTOM  OR DIAGNOSIS REQUIRED):   hx breast cancer    Preferred imaging location?:   [facility]   DG Bone Density    Standing Status:   Future    Expected Date:   11/03/2024    Expiration Date:   06/05/2025    Reason for Exam (SYMPTOM  OR DIAGNOSIS REQUIRED):   osteopenia    Preferred imaging location?:   [facility]   CBC with Differential (Cancer Center Only)    Standing Status:   Future    Expected Date:   12/03/2024    Expiration Date:   03/03/2025   CMP (Cancer Center only)    Standing Status:   Future    Expected Date:   12/03/2024    Expiration Date:   03/03/2025   Follow up in 6 months All questions were answered. The patient knows to call the clinic with any problems, questions or concerns.  Zelphia Cap, MD, PhD [facility] Hematology Oncology 06/05/2024    HISTORY OF PRESENTING ILLNESS:   Alexa Knight 68 y.o. female presents to establish care for history of right breast cancer.  I have reviewed her chart and materials related to her cancer extensively and collaborated history with the patient. Summary of oncologic history is as follows: Oncology History  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 in female, estrogen receptor positive (HCC)  07/28/2018 Initial Diagnosis   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 in female  2019 She had T1cN1a IDC of the right breast that was ER/PR positive and HER-2/neu negative.  She was being followed by Dr. Reda Molly in [place]  and patient has now relocated to [place]  and was followed by Dr. Melanee. Core needle biopsy had showed grade 1 ER 100% positive PR 10% positive and HER-2 negative tumor with a Ki-67 of 5%.  In October 2019 she underwent right lumpectomy with sentinel lymph node dissection which showed a 1.5 cm tumor with positive LVI and 2 out of 2 sentinel lymph nodes that were positive with no evidence of extra nuclear extension.  She received adjuvant dose dense AC chemotherapy and weekly Taxol between December 2019 to May 2020.  She then completed adjuvant radiation therapy and started Arimidex  in July 2020.       Genetic Testing   The [facility] Cancer[facility]-Expanded+RNA Panel found no pathogenic mutations.    05/27/2020 Cancer  Staging   Staging form: Breast, AJCC 8th Edition - Clinical stage from 05/27/2020: cT1, cN1, cM0, ER+, PR+, HER2- - Signed by Babara Call, MD on 06/02/2023 Stage prefix: Initial diagnosis   11/18/2022 Mammogram   Bilateral screening mammogram showed No mammographic evidence of malignancy.      has history of Hashimoto's thyroiditis for which she is on levothyroxine.  Patient was previously followed by Dr. Melanee and switch to me on 06/02/23.  She take Arimidex  1mg  daily, overall she tolerates well with manageable hot flash.  No new complaints.    MEDICAL HISTORY:  Past Medical  History:  Diagnosis Date   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, estrogen receptor positive (HCC) 2019   a.) stage I RIGHT breast cancer (T1c N1 M0, ER/PR+ HER2/neu -); s/p systemic chemotherapy + XRT + endocrine therapy   Hashimoto's thyroiditis    Hypothyroidism    Long term current use of aromatase inhibitor    a.) anastrozole    Mixed hyperlipidemia    Multinodular goiter    Osteopenia    Personal history of chemotherapy 2019   Rt breast   Personal history of radiation therapy 2019   Rt breast   Raynaud's syndrome    Rheumatoid factor positive     SURGICAL HISTORY: Past Surgical History:  Procedure Laterality Date   BREAST BIOPSY Left    stereo-neg   BREAST BIOPSY Right 2019   BREAST LUMPECTOMY Right 2019   invasive ductal carcinoma   COLONOSCOPY     SHOULDER ARTHROSCOPY WITH SUBACROMIAL DECOMPRESSION, ROTATOR CUFF REPAIR AND BICEP TENDON REPAIR Left 08/17/2023   Procedure: SHOULDER ARTHROSCOPY WITH DEBRIDEMENT, DECOMPRESSION, ROTATOR CUFF REPAIR AND BICEPS TENODESIS.;  Surgeon: Edie Norleen PARAS, MD;  Location: [facility] ORS;  Service: Orthopedics;  Laterality: Left;    SOCIAL HISTORY: Social History   Socioeconomic History   Marital status: Married    Spouse name: Gaither Semen   Number of children: 2   Years of education: Not on file   Highest education level: GED or equivalent  Occupational History   Occupation: retired  Tobacco Use   Smoking status: Former    Current packs/day: 0.00    Average packs/day: 1 pack/day for 26.0 years (26.0 ttl pk-yrs)    Types: Cigarettes    Start date: 80    Quit date: 2000    Years since quitting: 25.8   Smokeless tobacco: Never  Vaping Use   Vaping status: Never Used  Substance and Sexual Activity   Alcohol use: Yes    Alcohol/week: 1.0 standard drink of alcohol    Types: 1 Standard drinks or equivalent per week    Comment: 1 cocktail once per week or less   Drug use: Never   Sexual activity: Not Currently     Partners: Male    Birth control/protection: Post-menopausal  Other Topics Concern   Not on file  Social History Narrative   Not on file   Social Drivers of Health   Financial Resource Strain: Low Risk  (03/22/2024)   Overall Financial Resource Strain (CARDIA)    Difficulty of Paying Living Expenses: Not hard at all  Food Insecurity: No Food Insecurity (03/22/2024)   Hunger Vital Sign    Worried About Running Out of Food in the Last Year: Never true    Ran Out of Food in the Last Year: Never true  Transportation Needs: No Transportation Needs (03/22/2024)   PRAPARE - Administrator, Civil Service (Medical): No  Lack of Transportation (Non-Medical): No  Physical Activity: Insufficiently Active (03/22/2024)   Exercise Vital Sign    Days of Exercise per Week: 1 day    Minutes of Exercise per Session: 10 min  Stress: No Stress Concern Present (03/22/2024)   [employer] of Occupational Health - Occupational Stress Questionnaire    Feeling of Stress: Not at all  Social Connections: Socially Integrated (03/22/2024)   Social Connection and Isolation Panel    Frequency of Communication with Friends and Family: More than three times a week    Frequency of Social Gatherings with Friends and Family: Three times a week    Attends Religious Services: More than 4 times per year    Active Member of Clubs or Organizations: Yes    Attends Banker Meetings: More than 4 times per year    Marital Status: Married  Catering Manager Violence: Not At Risk (03/22/2024)   Humiliation, Afraid, Rape, and Kick questionnaire    Fear of Current or Ex-Partner: No    Emotionally Abused: No    Physically Abused: No    Sexually Abused: No    FAMILY HISTORY: Family History  Problem Relation Age of Onset   Breast cancer Mother 86       d. recurrence 15   Cancer Mother    Heart attack Father    Prostate cancer Brother    Prostate cancer Brother    Breast cancer Maternal Aunt         dx 54s   Cancer Maternal Aunt    Cancer Paternal Aunt        unk type    ALLERGIES:  has no known allergies.  MEDICATIONS:  Current Outpatient Medications  Medication Sig Dispense Refill   anastrozole  (ARIMIDEX ) 1 MG tablet Take 1 tablet (1 mg total) by mouth daily. 90 tablet 3   Calcium Carb-Cholecalciferol (CALCIUM 600 + D PO) Take 2 tablets by mouth daily.     levothyroxine (SYNTHROID) 100 MCG tablet Take 100 mcg by mouth daily before breakfast.      Multiple Vitamin (MULTI-VITAMIN DAILY PO) Take 1 tablet by mouth daily.     omeprazole  (PRILOSEC) 20 MG capsule Take 1 capsule by mouth once daily 90 capsule 0   No current facility-administered medications for this visit.    Review of Systems  Constitutional:  Negative for appetite change, chills, fatigue and fever.  HENT:   Negative for hearing loss and voice change.   Eyes:  Negative for eye problems.  Respiratory:  Negative for chest tightness and cough.   Cardiovascular:  Negative for chest pain.  Gastrointestinal:  Negative for abdominal distention, abdominal pain and blood in stool.  Endocrine: Negative for hot flashes.  Genitourinary:  Negative for difficulty urinating and frequency.   Musculoskeletal:  Negative for arthralgias.  Skin:  Negative for itching and rash.  Neurological:  Negative for extremity weakness.  Hematological:  Negative for adenopathy.  Psychiatric/Behavioral:  Negative for confusion.      PHYSICAL EXAMINATION: ECOG PERFORMANCE STATUS: 0 - Asymptomatic  Vitals:   06/05/24 1002  BP: 117/84  Pulse: 66  Resp: 18  Temp: (!) 96.3 F (35.7 C)  SpO2: 99%   Filed Weights   06/05/24 1002  Weight: 194 lb 3.2 oz (88.1 kg)    Physical Exam Constitutional:      General: She is not in acute distress.    Appearance: She is not diaphoretic.  HENT:     Head: Normocephalic and atraumatic.  Eyes:     General: No scleral icterus. Cardiovascular:     Rate and Rhythm: Normal rate and regular  rhythm.     Heart sounds: No murmur heard. Pulmonary:     Effort: Pulmonary effort is normal. No respiratory distress.  Abdominal:     General: There is no distension.     Palpations: Abdomen is soft.     Tenderness: There is no abdominal tenderness.  Musculoskeletal:        General: Normal range of motion.     Cervical back: Normal range of motion and neck supple.  Skin:    General: Skin is warm and dry.     Findings: No erythema.  Neurological:     Mental Status: She is alert and oriented to person, place, and time.     Cranial Nerves: No cranial nerve deficit.     Motor: No abnormal muscle tone.     Coordination: Coordination normal.  Psychiatric:        Mood and Affect: Affect normal.     LABORATORY DATA:  I have reviewed the data as listed    Latest Ref Rng & Units 06/05/2024    9:52 AM 12/01/2023    9:38 AM 08/11/2023    2:42 PM  CBC  WBC 4.0 - 10.5 K/uL 4.8  5.1  5.4   Hemoglobin 12.0 - 15.0 g/dL 87.1  87.0  87.0   Hematocrit 36.0 - 46.0 % 38.7  38.9  39.1   Platelets 150 - 400 K/uL 252  246  272       Latest Ref Rng & Units 06/05/2024    9:52 AM 12/01/2023    9:38 AM 08/11/2023    2:42 PM  CMP  Glucose 70 - 99 mg/dL 98  95  898   BUN 8 - 23 mg/dL 11  11  12    Creatinine 0.44 - 1.00 mg/dL 9.26  9.36  9.34   Sodium 135 - 145 mmol/L 137  139  136   Potassium 3.5 - 5.1 mmol/L 4.1  4.2  3.7   Chloride 98 - 111 mmol/L 103  103  102   CO2 22 - 32 mmol/L 26  27  26    Calcium 8.9 - 10.3 mg/dL 9.1  9.1  9.4   Total Protein 6.5 - 8.1 g/dL 7.5  7.4    Total Bilirubin 0.0 - 1.2 mg/dL 0.8  0.6    Alkaline Phos 38 - 126 U/L 91  116    AST 15 - 41 U/L 31  33    ALT 0 - 44 U/L 26  28       RADIOGRAPHIC STUDIES: I have personally reviewed the radiological images as listed and agreed with the findings in the report. No results found.

## 2024-06-05 NOTE — Assessment & Plan Note (Addendum)
 Stage I right breast cancer, T1c N1 M0 ER+, PR+ HER2 negative.  S/p lumpectomy w SLNB, adjuvant chemotherapy and radiation.  Recommend patient to continue endocrine therapy with Arimidex  1mg  daily for at least 5 years [started in July 2020]. Share decision was made to extend therapy beyond 5 year. .  Arimidex  Rx was refilled.  Continue annual screening mammogram, next due April 2026

## 2024-06-25 ENCOUNTER — Other Ambulatory Visit: Payer: Self-pay | Admitting: Internal Medicine

## 2024-06-25 DIAGNOSIS — K219 Gastro-esophageal reflux disease without esophagitis: Secondary | ICD-10-CM

## 2024-06-28 ENCOUNTER — Telehealth: Payer: Self-pay | Admitting: Internal Medicine

## 2024-06-28 ENCOUNTER — Other Ambulatory Visit: Payer: Self-pay

## 2024-06-28 NOTE — Telephone Encounter (Signed)
 Copied from CRM #[id number]. Topic: Clinical - Medication Question >> Jun 28, 2024 11:39 AM Travis F wrote: Reason for CRM: Patient is calling in to check on the status of a refill request for omeprazole  (PRILOSEC) 20 MG capsule [[id number]] on 06/25/24, Pharmacy sent a follow up on 06/28/24. Please advise.

## 2024-06-28 NOTE — Telephone Encounter (Signed)
Sent pt message on Mychart.  KP

## 2024-07-03 DIAGNOSIS — E063 Autoimmune thyroiditis: Secondary | ICD-10-CM | POA: Diagnosis not present

## 2024-07-10 DIAGNOSIS — E063 Autoimmune thyroiditis: Secondary | ICD-10-CM | POA: Diagnosis not present

## 2024-07-10 DIAGNOSIS — E042 Nontoxic multinodular goiter: Secondary | ICD-10-CM | POA: Diagnosis not present

## 2024-07-10 DIAGNOSIS — Z1331 Encounter for screening for depression: Secondary | ICD-10-CM | POA: Diagnosis not present

## 2024-10-30 ENCOUNTER — Encounter

## 2024-10-30 ENCOUNTER — Other Ambulatory Visit

## 2024-12-04 ENCOUNTER — Inpatient Hospital Stay

## 2024-12-04 ENCOUNTER — Inpatient Hospital Stay: Admitting: Oncology
